# Patient Record
Sex: Female | Born: 1945 | Race: White | Hispanic: No | Marital: Married | Smoking: Never smoker
Health system: Southern US, Community
[De-identification: ages and names within clinical notes are randomized; demographics above are authoritative.]

## PROBLEM LIST (undated history)

## (undated) HISTORY — PX: TONSILECTOMY, ADENOIDECTOMY, BILATERAL MYRINGOTOMY AND TUBES: SHX2538

---

## 2016-10-29 ENCOUNTER — Encounter (HOSPITAL_COMMUNITY): Payer: Self-pay | Admitting: Emergency Medicine

## 2016-10-29 ENCOUNTER — Emergency Department (HOSPITAL_COMMUNITY): Payer: No Typology Code available for payment source

## 2016-10-29 ENCOUNTER — Inpatient Hospital Stay (HOSPITAL_COMMUNITY)
Admission: EM | Admit: 2016-10-29 | Discharge: 2016-11-08 | DRG: 481 | Disposition: A | Discharge status: Rehabilitation Facility | Payer: No Typology Code available for payment source | Attending: Orthopedic Surgery | Admitting: Orthopedic Surgery

## 2016-10-29 DIAGNOSIS — S0083XA Contusion of other part of head, initial encounter: Secondary | ICD-10-CM | POA: Diagnosis present

## 2016-10-29 DIAGNOSIS — E559 Vitamin D deficiency, unspecified: Secondary | ICD-10-CM | POA: Diagnosis present

## 2016-10-29 DIAGNOSIS — S72432A Displaced fracture of medial condyle of left femur, initial encounter for closed fracture: Principal | ICD-10-CM

## 2016-10-29 DIAGNOSIS — D62 Acute posthemorrhagic anemia: Secondary | ICD-10-CM

## 2016-10-29 DIAGNOSIS — Z419 Encounter for procedure for purposes other than remedying health state, unspecified: Secondary | ICD-10-CM

## 2016-10-29 DIAGNOSIS — R262 Difficulty in walking, not elsewhere classified: Secondary | ICD-10-CM

## 2016-10-29 DIAGNOSIS — T148XXA Other injury of unspecified body region, initial encounter: Secondary | ICD-10-CM | POA: Diagnosis not present

## 2016-10-29 DIAGNOSIS — R300 Dysuria: Secondary | ICD-10-CM | POA: Diagnosis not present

## 2016-10-29 DIAGNOSIS — Y92410 Unspecified street and highway as the place of occurrence of the external cause: Secondary | ICD-10-CM | POA: Diagnosis not present

## 2016-10-29 DIAGNOSIS — K5903 Drug induced constipation: Secondary | ICD-10-CM

## 2016-10-29 DIAGNOSIS — S72435S Nondisplaced fracture of medial condyle of left femur, sequela: Secondary | ICD-10-CM | POA: Diagnosis not present

## 2016-10-29 DIAGNOSIS — I10 Essential (primary) hypertension: Secondary | ICD-10-CM | POA: Diagnosis present

## 2016-10-29 DIAGNOSIS — R739 Hyperglycemia, unspecified: Secondary | ICD-10-CM | POA: Diagnosis present

## 2016-10-29 DIAGNOSIS — I158 Other secondary hypertension: Secondary | ICD-10-CM | POA: Diagnosis not present

## 2016-10-29 DIAGNOSIS — E8809 Other disorders of plasma-protein metabolism, not elsewhere classified: Secondary | ICD-10-CM | POA: Diagnosis not present

## 2016-10-29 DIAGNOSIS — S72432G Displaced fracture of medial condyle of left femur, subsequent encounter for closed fracture with delayed healing: Secondary | ICD-10-CM | POA: Diagnosis not present

## 2016-10-29 DIAGNOSIS — D72829 Elevated white blood cell count, unspecified: Secondary | ICD-10-CM | POA: Diagnosis present

## 2016-10-29 DIAGNOSIS — G8918 Other acute postprocedural pain: Secondary | ICD-10-CM | POA: Diagnosis not present

## 2016-10-29 DIAGNOSIS — I82432 Acute embolism and thrombosis of left popliteal vein: Secondary | ICD-10-CM | POA: Diagnosis not present

## 2016-10-29 DIAGNOSIS — M25562 Pain in left knee: Secondary | ICD-10-CM | POA: Diagnosis present

## 2016-10-29 DIAGNOSIS — S72415S Nondisplaced unspecified condyle fracture of lower end of left femur, sequela: Secondary | ICD-10-CM | POA: Diagnosis not present

## 2016-10-29 LAB — CBC WITH DIFFERENTIAL/PLATELET
Basophils Absolute: 0 10*3/uL (ref 0.0–0.1)
Basophils Relative: 0 %
Eosinophils Absolute: 0 10*3/uL (ref 0.0–0.7)
Eosinophils Relative: 0 %
HCT: 39.9 % (ref 36.0–46.0)
Hemoglobin: 14 g/dL (ref 12.0–15.0)
Lymphocytes Relative: 15 %
Lymphs Abs: 2.5 10*3/uL (ref 0.7–4.0)
MCH: 32.1 pg (ref 26.0–34.0)
MCHC: 35.1 g/dL (ref 30.0–36.0)
MCV: 91.5 fL (ref 78.0–100.0)
Monocytes Absolute: 1.3 10*3/uL — ABNORMAL HIGH (ref 0.1–1.0)
Monocytes Relative: 8 %
Neutro Abs: 12.7 10*3/uL — ABNORMAL HIGH (ref 1.7–7.7)
Neutrophils Relative %: 77 %
Platelets: 332 10*3/uL (ref 150–400)
RBC: 4.36 MIL/uL (ref 3.87–5.11)
RDW: 12.4 % (ref 11.5–15.5)
WBC: 16.6 10*3/uL — ABNORMAL HIGH (ref 4.0–10.5)

## 2016-10-29 MED ORDER — ONDANSETRON HCL 4 MG/2ML IJ SOLN
4.0000 mg | Freq: Once | INTRAMUSCULAR | Status: AC
  Administered 2016-10-29: 4 mg via INTRAVENOUS
  Filled 2016-10-29: qty 2

## 2016-10-29 MED ORDER — SODIUM CHLORIDE 0.9 % IV BOLUS (SEPSIS)
1000.0000 mL | Freq: Once | INTRAVENOUS | Status: AC
  Administered 2016-10-29: 1000 mL via INTRAVENOUS

## 2016-10-29 MED ORDER — HYDROMORPHONE HCL 2 MG/ML IJ SOLN: 1.0000 mg | Freq: Once | INTRAMUSCULAR | Status: DC

## 2016-10-29 MED ORDER — HYDROMORPHONE HCL 2 MG/ML IJ SOLN
0.5000 mg | Freq: Once | INTRAMUSCULAR | Status: AC
  Administered 2016-10-29: 0.5 mg via INTRAVENOUS
  Filled 2016-10-29: qty 1

## 2016-10-29 MED ORDER — IOPAMIDOL (ISOVUE-300) INJECTION 61%
INTRAVENOUS | Status: AC
  Administered 2016-10-29: 100 mL
  Filled 2016-10-29: qty 100
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
## 2016-10-30 ENCOUNTER — Inpatient Hospital Stay (HOSPITAL_COMMUNITY): Payer: No Typology Code available for payment source

## 2016-10-30 LAB — LACTIC ACID, PLASMA
LACTIC ACID, VENOUS: 1.4 mmol/L (ref 0.5–1.9)
LACTIC ACID, VENOUS: 3.2 mmol/L — AB (ref 0.5–1.9)
Lactic Acid, Venous: 3.3 mmol/L (ref 0.5–1.9)

## 2016-10-30 LAB — BASIC METABOLIC PANEL
Anion gap: 10 (ref 5–15)
Anion gap: 8 (ref 5–15)
BUN: 13 mg/dL (ref 6–20)
BUN: 9 mg/dL (ref 6–20)
CALCIUM: 8.6 mg/dL — AB (ref 8.9–10.3)
CO2: 24 mmol/L (ref 22–32)
CO2: 23 mmol/L (ref 22–32)
CREATININE: 0.81 mg/dL (ref 0.44–1.00)
Calcium: 8.6 mg/dL — ABNORMAL LOW (ref 8.9–10.3)
Chloride: 106 mmol/L (ref 101–111)
Chloride: 108 mmol/L (ref 101–111)
Creatinine, Ser: 0.88 mg/dL (ref 0.44–1.00)
GFR calc Af Amer: >60 mL/min (ref >60)
GFR calc non Af Amer: >60 mL/min (ref >60)
Glucose, Bld: 119 mg/dL — ABNORMAL HIGH (ref 65–99)
Glucose, Bld: 132 mg/dL — ABNORMAL HIGH (ref 65–99)
Potassium: 3.7 mmol/L (ref 3.5–5.1)
Potassium: 4 mmol/L (ref 3.5–5.1)
SODIUM: 139 mmol/L (ref 135–145)
Sodium: 140 mmol/L (ref 135–145)

## 2016-10-30 LAB — CBC
HEMATOCRIT: 37.7 % (ref 36.0–46.0)
Hemoglobin: 13.1 g/dL (ref 12.0–15.0)
MCH: 32 pg (ref 26.0–34.0)
MCHC: 34.7 g/dL (ref 30.0–36.0)
MCV: 92 fL (ref 78.0–100.0)
PLATELETS: 343 10*3/uL (ref 150–400)
RBC: 4.1 MIL/uL (ref 3.87–5.11)
RDW: 12.5 % (ref 11.5–15.5)
WBC: 11.2 10*3/uL — AB (ref 4.0–10.5)

## 2016-10-30 MED ORDER — DEXTROSE 5 % IV SOLN: 500.0000 mg | Freq: Four times a day (QID) | INTRAVENOUS | Status: DC | PRN

## 2016-10-30 MED ORDER — PROMETHAZINE HCL 25 MG/ML IJ SOLN
12.5000 mg | Freq: Four times a day (QID) | INTRAMUSCULAR | Status: DC | PRN
  Administered 2016-10-30 – 2016-11-04 (×4): 12.5 mg via INTRAVENOUS
  Filled 2016-10-30 (×4): qty 1

## 2016-10-30 MED ORDER — ONDANSETRON HCL 4 MG/2ML IJ SOLN
4.0000 mg | Freq: Four times a day (QID) | INTRAMUSCULAR | Status: DC | PRN
  Administered 2016-10-30: 4 mg via INTRAVENOUS
  Filled 2016-10-30: qty 2

## 2016-10-30 MED ORDER — DIPHENHYDRAMINE HCL 12.5 MG/5ML PO ELIX
12.5000 mg | ORAL_SOLUTION | ORAL | Status: DC | PRN
  Administered 2016-11-05: 12.5 mg via ORAL
  Filled 2016-10-30: qty 10

## 2016-10-30 MED ORDER — OXYCODONE HCL 5 MG PO TABS
5.0000 mg | ORAL_TABLET | ORAL | Status: DC | PRN
  Administered 2016-11-02 (×2): 5 mg via ORAL
  Administered 2016-11-02 – 2016-11-08 (×34): 10 mg via ORAL
  Filled 2016-10-30 (×25): qty 2
  Filled 2016-10-30: qty 1
  Filled 2016-10-30 (×10): qty 2

## 2016-10-30 MED ORDER — METHOCARBAMOL 500 MG PO TABS
500.0000 mg | ORAL_TABLET | Freq: Four times a day (QID) | ORAL | Status: DC | PRN
  Administered 2016-10-30 – 2016-11-01 (×5): 500 mg via ORAL
  Filled 2016-10-30 (×6): qty 1

## 2016-10-30 MED ORDER — TRAMADOL HCL 50 MG PO TABS
50.0000 mg | ORAL_TABLET | Freq: Four times a day (QID) | ORAL | Status: DC | PRN
  Administered 2016-10-31 – 2016-11-01 (×5): 50 mg via ORAL
  Filled 2016-10-30 (×5): qty 1

## 2016-10-30 MED ORDER — ACETAMINOPHEN 650 MG RE SUPP: 650.0000 mg | Freq: Four times a day (QID) | RECTAL | Status: DC | PRN

## 2016-10-30 MED ORDER — SODIUM CHLORIDE 0.9 % IV SOLN
INTRAVENOUS | Status: DC
  Administered 2016-10-30 – 2016-11-01 (×3): via INTRAVENOUS

## 2016-10-30 MED ORDER — HYDROMORPHONE HCL 2 MG/ML IJ SOLN
1.0000 mg | INTRAMUSCULAR | Status: DC | PRN
  Administered 2016-10-30 – 2016-11-04 (×4): 1 mg via INTRAVENOUS
  Filled 2016-10-30 (×5): qty 1

## 2016-10-30 MED ORDER — ACETAMINOPHEN 325 MG PO TABS
650.0000 mg | ORAL_TABLET | Freq: Four times a day (QID) | ORAL | Status: DC | PRN
  Administered 2016-10-30 – 2016-10-31 (×4): 650 mg via ORAL
  Filled 2016-10-30 (×4): qty 2

## 2016-10-30 NOTE — Progress Notes (Signed)
Patient complained of nausea and vomiting most of the day. Contacted Dr. August Saucerean and obtained an order for Zofran prn. Administered at 1520 via IV. Patient complained immediately of feeling flushed and had more nausea. Further complaints were headache, dizziness, feeling warm all over, and diaphoresis was observed. Patient requests no more Zofran be given. Gildardo CrankerAnita Priddy, RN
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
## 2016-10-30 NOTE — Progress Notes (Signed)
PT Cancellation Note  Patient Details Name: [name] FullingJanet White MRN: [id number] DOB: [birth year]   Cancelled Treatment:     Hold until 10/31/2016 per MD for CT of Left and x-ray of right knee.    Dessie ComaDavid J Jaquel Coomer 10/30/2016, 3:46 PM

## 2016-10-30 NOTE — ED Notes (Signed)
Attempted to call floor again, still no answer. This RN will bring Pt to floor and do bedside reporting.
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
## 2016-10-30 NOTE — ED Notes (Signed)
Attempted to call report, nobody answered.

## 2016-10-30 NOTE — Progress Notes (Signed)
OT Cancellation Note  Patient Details Name: [name] FullingJanet White MRN: [id number] DOB: 1946-08-23   Cancelled Treatment:    Reason Eval/Treat Not Completed: Medical issues which prohibited therapy. MD requesting hold therapy until after CT scan. OT will continue to follow.   Evern BioLaura J Shandy Checo 10/30/2016, 10:03 AM  Sherryl MangesLaura Arieonna Medine OTR/L [phone]

## 2016-10-30 NOTE — Progress Notes (Signed)
Patient is admitted in room 5N31, alert, oriented x4, abraisions noted to left side of nose, left cheeck left eye lid and left side of face, all are opened to air and not draining or bleeding, right knee and left elbow  With some swelling and bruises, left 2nd and 3rd fingers with bruises, left knee with immobilizer and unable to remove it due to pain, no bleeding noted, no other skin issues noted other than mentioned above

## 2016-10-30 NOTE — Progress Notes (Signed)
Call received from Lab for a critic Lactic Acid of 3.3 earlier tonight, MDs answering service was called and related message for on call physician, no call back received until now. Patient is resting in the bed with eyes closed at present moment, no S/S of acute distress noted, O2 on at 2liters via N/C , sats 96-98%, family at bedside. Will continue to monitor.

## 2016-10-30 NOTE — Plan of Care (Signed)
Problem: Safety: Goal: Ability to remain free from injury will improve Outcome: Progressing On bed rest tonight  Problem: Pain Managment: Goal: General experience of comfort will improve Outcome: Progressing Medicated once for pain, resting in the bed with eyes closed at present time  Problem: Skin Integrity: Goal: Risk for impaired skin integrity will decrease Outcome: Progressing Bruises and small hematoma noted to right knee and left elbow  Problem: Activity: Goal: Risk for activity intolerance will decrease Outcome: Progressing On bed rest, C/O severe pain in the left leg, does not tolerate turning and repositioning  Problem: Bowel/Gastric: Goal: Will not experience complications related to bowel motility Outcome: Progressing Denies gastric and bowel issues

## 2016-10-31 LAB — CBC
HEMATOCRIT: 36 % (ref 36.0–46.0)
HEMOGLOBIN: 12.1 g/dL (ref 12.0–15.0)
MCH: 31.3 pg (ref 26.0–34.0)
MCHC: 33.6 g/dL (ref 30.0–36.0)
MCV: 93 fL (ref 78.0–100.0)
Platelets: 304 10*3/uL (ref 150–400)
RBC: 3.87 MIL/uL (ref 3.87–5.11)
RDW: 12.6 % (ref 11.5–15.5)
WBC: 10.5 10*3/uL (ref 4.0–10.5)

## 2016-10-31 MED ORDER — ENOXAPARIN SODIUM 40 MG/0.4ML ~~LOC~~ SOLN
40.0000 mg | SUBCUTANEOUS | Status: DC
  Filled 2016-10-31: qty 0.4

## 2016-10-31 MED ORDER — CYCLOBENZAPRINE HCL 10 MG PO TABS
5.0000 mg | ORAL_TABLET | Freq: Three times a day (TID) | ORAL | Status: DC | PRN
  Administered 2016-10-31: 10 mg via ORAL
  Filled 2016-10-31: qty 1

## 2016-10-31 NOTE — Progress Notes (Signed)
Patient is alert, oriented x4, awake at present time, medicated for pain with Tylenol twice, Phenergan twice to prevent nausea/vomiting and once with Tramadol all  with moderate relief. Skin care completed, patient has been turned and repositioned. Knee immobilizer remains on, Foot pump on, VS WNL, no visual signs of acute distress noted, son and daughter at bedside at present time, will continue to monitor.

## 2016-10-31 NOTE — Progress Notes (Signed)
Rehab Admissions Coordinator Note:  Patient was screened by Trish MageLogue, Piero Mustard M for appropriateness for an Inpatient Acute Rehab Consult.  At this time, we are recommending Skilled Nursing Facility.  [facility] and it is unlikely that they would authorize an acute inpatient rehab admission given current diagnosis.  However, if MD chooses to pursue inpatient rehab, can order rehab consult.  Trish MageLogue, Delson Dulworth M 10/31/2016, 7:07 PM  I can be reached at [phone].
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
## 2016-10-31 NOTE — Progress Notes (Signed)
Subjective: No acute changes.  Feels better.  Vitals and H/H stable.  Objective: Vital signs in last 24 hours: Temp:  [97.9 F (36.6 C)-98.7 F (37.1 C)] 97.9 F (36.6 C) (01/14 0450) Pulse Rate:  [69-76] 71 (01/14 0450) Resp:  [16] 16 (01/14 0450) BP: (133-149)/(5-62) 137/5 (01/14 0450) SpO2:  [94 %-98 %] 98 % (01/14 0450)  Intake/Output from previous day: 01/13 0701 - 01/14 0700 In: 1185 [P.O.:360; I.V.:825] Out: -  Intake/Output this shift: Total I/O In: 120 [P.O.:120] Out: 300 [Urine:300]   Recent Labs  10/29/16 2120 10/30/16 1005 10/31/16 0440  HGB 14.0 13.1 12.1    Recent Labs  10/30/16 1005 10/31/16 0440  WBC 11.2* 10.5  RBC 4.10 3.87  HCT 37.7 36.0  PLT 343 304    Recent Labs  10/29/16 2120 10/30/16 1005  NA 140 139  K 3.7 4.0  CL 106 108  CO2 24 23  BUN 13 9  CREATININE 0.88 0.81  GLUCOSE 119* 132*  CALCIUM 8.6* 8.6*   No results for input(s): LABPT, INR in the last 72 hours.  Sensation intact distally Intact pulses distally Dorsiflexion/Plantar flexion intact Compartment soft Left knee swollen (to be expected). Abdomen soft  Assessment/Plan: Left distal femur fracture 1)  I reviewed the CT scan of her left knee at the bedside.  She will need surgery to stabilize the medial epicondyl fracture.  Will plan on surgery possibly Tuesday afternoon.  I have reached out to Dr. Carola FrostHandy, Ortho Trauma, for a consultation depending on his availability.  The family understands this as well.  Her right knee x-ray was normal and that knee only hurts over her bruise.  She can get up to a wheelchair today to go see her husband who is also hospitalized here.   Kathryne HitchChristopher Y Tarez Bowns 10/31/2016, 8:57 AM
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
## 2016-11-01 ENCOUNTER — Encounter (HOSPITAL_COMMUNITY): Payer: Self-pay | Admitting: *Deleted

## 2016-11-01 DIAGNOSIS — S72432G Displaced fracture of medial condyle of left femur, subsequent encounter for closed fracture with delayed healing: Secondary | ICD-10-CM

## 2016-11-01 DIAGNOSIS — T148XXA Other injury of unspecified body region, initial encounter: Secondary | ICD-10-CM

## 2016-11-01 DIAGNOSIS — R262 Difficulty in walking, not elsewhere classified: Secondary | ICD-10-CM

## 2016-11-01 DIAGNOSIS — R739 Hyperglycemia, unspecified: Secondary | ICD-10-CM

## 2016-11-01 DIAGNOSIS — I10 Essential (primary) hypertension: Secondary | ICD-10-CM

## 2016-11-01 DIAGNOSIS — D72829 Elevated white blood cell count, unspecified: Secondary | ICD-10-CM

## 2016-11-01 DIAGNOSIS — G8918 Other acute postprocedural pain: Secondary | ICD-10-CM

## 2016-11-01 LAB — CBC
HEMATOCRIT: 36.3 % (ref 36.0–46.0)
HEMOGLOBIN: 12.2 g/dL (ref 12.0–15.0)
MCH: 31.3 pg (ref 26.0–34.0)
MCHC: 33.6 g/dL (ref 30.0–36.0)
MCV: 93.1 fL (ref 78.0–100.0)
Platelets: 335 10*3/uL (ref 150–400)
RBC: 3.9 MIL/uL (ref 3.87–5.11)
RDW: 12.5 % (ref 11.5–15.5)
WBC: 11 10*3/uL — AB (ref 4.0–10.5)

## 2016-11-01 LAB — BASIC METABOLIC PANEL
Anion gap: 6 (ref 5–15)
BUN: 5 mg/dL — ABNORMAL LOW (ref 6–20)
CHLORIDE: 111 mmol/L (ref 101–111)
CO2: 26 mmol/L (ref 22–32)
Calcium: 8.6 mg/dL — ABNORMAL LOW (ref 8.9–10.3)
Creatinine, Ser: 0.75 mg/dL (ref 0.44–1.00)
GFR calc non Af Amer: >60 mL/min (ref >60)
Glucose, Bld: 107 mg/dL — ABNORMAL HIGH (ref 65–99)
POTASSIUM: 3.8 mmol/L (ref 3.5–5.1)
Sodium: 143 mmol/L (ref 135–145)

## 2016-11-01 MED ORDER — TRAMADOL HCL 50 MG PO TABS
100.0000 mg | ORAL_TABLET | Freq: Four times a day (QID) | ORAL | Status: DC | PRN
  Administered 2016-11-01 – 2016-11-08 (×6): 100 mg via ORAL
  Filled 2016-11-01 (×6): qty 2

## 2016-11-01 MED ORDER — ACETAMINOPHEN 500 MG PO TABS
1000.0000 mg | ORAL_TABLET | Freq: Four times a day (QID) | ORAL | Status: DC
  Administered 2016-11-01 – 2016-11-06 (×19): 1000 mg via ORAL
  Administered 2016-11-06: 500 mg via ORAL
  Administered 2016-11-06 – 2016-11-08 (×7): 1000 mg via ORAL
  Filled 2016-11-01 (×30): qty 2

## 2016-11-01 MED ORDER — ACETAMINOPHEN 650 MG RE SUPP
650.0000 mg | Freq: Four times a day (QID) | RECTAL | Status: DC
  Filled 2016-11-01: qty 1

## 2016-11-01 MED ORDER — METHOCARBAMOL 500 MG PO TABS
500.0000 mg | ORAL_TABLET | Freq: Four times a day (QID) | ORAL | Status: DC | PRN
  Administered 2016-11-01 – 2016-11-03 (×7): 1000 mg via ORAL
  Administered 2016-11-03: 500 mg via ORAL
  Administered 2016-11-03: 1000 mg via ORAL
  Administered 2016-11-04 (×2): 500 mg via ORAL
  Administered 2016-11-04: 1000 mg via ORAL
  Administered 2016-11-05: 500 mg via ORAL
  Administered 2016-11-05 – 2016-11-07 (×7): 1000 mg via ORAL
  Administered 2016-11-08: 500 mg via ORAL
  Administered 2016-11-08: 1000 mg via ORAL
  Filled 2016-11-01 (×4): qty 2
  Filled 2016-11-01 (×3): qty 1
  Filled 2016-11-01 (×2): qty 2
  Filled 2016-11-01: qty 1
  Filled 2016-11-01 (×14): qty 2

## 2016-11-01 MED ORDER — METHOCARBAMOL 1000 MG/10ML IJ SOLN: 500.0000 mg | Freq: Four times a day (QID) | INTRAMUSCULAR | Status: DC | PRN

## 2016-11-01 MED ORDER — CEFAZOLIN SODIUM-DEXTROSE 2-4 GM/100ML-% IV SOLN
2.0000 g | Freq: Once | INTRAVENOUS | Status: AC
  Administered 2016-11-02: 2 g via INTRAVENOUS
  Filled 2016-11-01: qty 100
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
## 2016-11-01 NOTE — Anesthesia Preprocedure Evaluation (Addendum)
Anesthesia Evaluation  Patient identified by MRN, date of birth, ID band Patient awake    Reviewed: Allergy & Precautions, NPO status , Patient's Chart, lab work & pertinent test results  Airway Mallampati: II  TM Distance: >3 FB Neck ROM: Full    Dental no notable dental hx.    Pulmonary neg pulmonary ROS,    Pulmonary exam normal breath sounds clear to auscultation       Cardiovascular hypertension, Normal cardiovascular exam Rhythm:Regular Rate:Normal     Neuro/Psych negative neurological ROS  negative psych ROS   GI/Hepatic negative GI ROS, Neg liver ROS,   Endo/Other  negative endocrine ROS  Renal/GU negative Renal ROS     Musculoskeletal negative musculoskeletal ROS (+)   Abdominal   Peds  Hematology negative hematology ROS (+)   Anesthesia Other Findings   Reproductive/Obstetrics negative OB ROS                             Anesthesia Physical Anesthesia Plan  ASA: II  Anesthesia Plan: Spinal   Post-op Pain Management:  Regional for Post-op pain   Induction: Intravenous  Airway Management Planned:   Additional Equipment:   Intra-op Plan:   Post-operative Plan:   Informed Consent: I have reviewed the patients History and Physical, chart, labs and discussed the procedure including the risks, benefits and alternatives for the proposed anesthesia with the patient or authorized representative who has indicated his/her understanding and acceptance.   Dental advisory given  Plan Discussed with: CRNA  Anesthesia Plan Comments:       Anesthesia Quick Evaluation

## 2016-11-01 NOTE — Progress Notes (Addendum)
I met with pt and a grand daughter at bedside. I discussed a possible inpt rehab admission if approved by her [employer] Medicare insurance. I await therapy progress postop and then I will pursue approval for a CIR admission. [phone] 
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
## 2016-11-01 NOTE — Progress Notes (Signed)
Subjective: No acute changes.  Tried to mobilize more yesterday.  Understands surgery will be tomorrow.  Objective: Vital signs in last 24 hours: Temp:  [97.3 F (36.3 C)-98.8 F (37.1 C)] 97.3 F (36.3 C) (01/15 0500) Pulse Rate:  [71-83] 71 (01/15 0500) Resp:  [16] 16 (01/15 0500) BP: (140-153)/(57-70) 149/70 (01/15 0500) SpO2:  [94 %-98 %] 94 % (01/15 0500)  Intake/Output from previous day: 01/14 0701 - 01/15 0700 In: 880 [P.O.:880] Out: 725 [Urine:725] Intake/Output this shift: No intake/output data recorded.   Recent Labs  10/29/16 2120 10/30/16 1005 10/31/16 0440 11/01/16 0427  HGB 14.0 13.1 12.1 12.2    Recent Labs  10/31/16 0440 11/01/16 0427  WBC 10.5 11.0*  RBC 3.87 3.90  HCT 36.0 36.3  PLT 304 335    Recent Labs  10/30/16 1005 11/01/16 0427  NA 139 143  K 4.0 3.8  CL 108 111  CO2 23 26  BUN 9 5*  CREATININE 0.81 0.75  GLUCOSE 132* 107*  CALCIUM 8.6* 8.6*   No results for input(s): LABPT, INR in the last 72 hours.  Sensation intact distally Intact pulses distally Dorsiflexion/Plantar flexion intact Compartment soft  Assessment/Plan: Left distal femur medial epicondyl fracture. 1)  Will proceed to the OR tomorrow.  Have consulted Dr. Carola FrostHandy, Ortho Trauma, for his expertise.   [name] HitchChristopher Y Miryah Ralls 11/01/2016, 7:06 AM
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
## 2016-11-02 ENCOUNTER — Inpatient Hospital Stay (HOSPITAL_COMMUNITY): Payer: No Typology Code available for payment source

## 2016-11-02 ENCOUNTER — Encounter (HOSPITAL_COMMUNITY)
Admission: EM | Disposition: A | Discharge status: Rehabilitation Facility | Payer: Self-pay | Source: Home / Self Care | Attending: Orthopaedic Surgery

## 2016-11-02 ENCOUNTER — Encounter (HOSPITAL_COMMUNITY): Payer: Self-pay | Admitting: Certified Registered"

## 2016-11-02 ENCOUNTER — Inpatient Hospital Stay (HOSPITAL_COMMUNITY): Payer: No Typology Code available for payment source | Admitting: Anesthesiology

## 2016-11-02 HISTORY — PX: ORIF FEMUR FRACTURE: SHX2119

## 2016-11-02 LAB — CBC
HCT: 37.4 % (ref 36.0–46.0)
HCT: 38.3 % (ref 36.0–46.0)
Hemoglobin: 12.8 g/dL (ref 12.0–15.0)
Hemoglobin: 13.1 g/dL (ref 12.0–15.0)
MCH: 31.4 pg (ref 26.0–34.0)
MCH: 31.3 pg (ref 26.0–34.0)
MCHC: 34.2 g/dL (ref 30.0–36.0)
MCHC: 34.2 g/dL (ref 30.0–36.0)
MCV: 91.7 fL (ref 78.0–100.0)
MCV: 91.4 fL (ref 78.0–100.0)
PLATELETS: 365 10*3/uL (ref 150–400)
Platelets: 375 10*3/uL (ref 150–400)
RBC: 4.08 MIL/uL (ref 3.87–5.11)
RBC: 4.19 MIL/uL (ref 3.87–5.11)
RDW: 12.6 % (ref 11.5–15.5)
RDW: 12.7 % (ref 11.5–15.5)
WBC: 11.3 10*3/uL — ABNORMAL HIGH (ref 4.0–10.5)
WBC: 10.9 10*3/uL — ABNORMAL HIGH (ref 4.0–10.5)

## 2016-11-02 LAB — TYPE AND SCREEN
ABO/RH(D): B POS
Antibody Screen: NEGATIVE

## 2016-11-02 LAB — COMPREHENSIVE METABOLIC PANEL
ALBUMIN: 3.2 g/dL — AB (ref 3.5–5.0)
ALK PHOS: 67 U/L (ref 38–126)
ALT: 43 U/L (ref 14–54)
ANION GAP: 9 (ref 5–15)
AST: 25 U/L (ref 15–41)
BUN: 6 mg/dL (ref 6–20)
CALCIUM: 8.9 mg/dL (ref 8.9–10.3)
CO2: 25 mmol/L (ref 22–32)
Chloride: 107 mmol/L (ref 101–111)
Creatinine, Ser: 0.7 mg/dL (ref 0.44–1.00)
GFR calc Af Amer: >60 mL/min (ref >60)
GFR calc non Af Amer: >60 mL/min (ref >60)
GLUCOSE: 107 mg/dL — AB (ref 65–99)
Potassium: 3.6 mmol/L (ref 3.5–5.1)
SODIUM: 141 mmol/L (ref 135–145)
Total Bilirubin: 0.8 mg/dL (ref 0.3–1.2)
Total Protein: 6.1 g/dL — ABNORMAL LOW (ref 6.5–8.1)

## 2016-11-02 LAB — SURGICAL PCR SCREEN
MRSA, PCR: NEGATIVE
STAPHYLOCOCCUS AUREUS: NEGATIVE

## 2016-11-02 LAB — ABO/RH: ABO/RH(D): B POS

## 2016-11-02 LAB — PROTIME-INR
INR: 1.06
PROTHROMBIN TIME: 13.8 s (ref 11.4–15.2)

## 2016-11-02 LAB — CREATININE, SERUM
Creatinine, Ser: 0.72 mg/dL (ref 0.44–1.00)
GFR calc Af Amer: >60 mL/min (ref >60)
GFR calc non Af Amer: >60 mL/min (ref >60)

## 2016-11-02 LAB — APTT: APTT: 29 s (ref 24–36)

## 2016-11-02 SURGERY — OPEN REDUCTION INTERNAL FIXATION (ORIF) DISTAL FEMUR FRACTURE
Anesthesia: Regional | Laterality: Left

## 2016-11-02 MED ORDER — PROPOFOL 10 MG/ML IV BOLUS
INTRAVENOUS | Status: DC | PRN
  Administered 2016-11-02: 30 mg via INTRAVENOUS

## 2016-11-02 MED ORDER — WHITE PETROLATUM GEL: 1.0000 "application " | Status: DC | PRN

## 2016-11-02 MED ORDER — FENTANYL CITRATE (PF) 100 MCG/2ML IJ SOLN
INTRAMUSCULAR | Status: AC
  Filled 2016-11-02: qty 4

## 2016-11-02 MED ORDER — ENOXAPARIN SODIUM 40 MG/0.4ML ~~LOC~~ SOLN
40.0000 mg | SUBCUTANEOUS | Status: DC
  Administered 2016-11-03 – 2016-11-08 (×6): 40 mg via SUBCUTANEOUS
  Filled 2016-11-02 (×6): qty 0.4

## 2016-11-02 MED ORDER — LACTATED RINGERS IV SOLN
INTRAVENOUS | Status: DC | PRN
Start: 2016-11-02 — End: 2016-11-02
  Administered 2016-11-02: 08:00:00 via INTRAVENOUS

## 2016-11-02 MED ORDER — DOCUSATE SODIUM 100 MG PO CAPS
100.0000 mg | ORAL_CAPSULE | Freq: Two times a day (BID) | ORAL | Status: DC
  Administered 2016-11-02 – 2016-11-08 (×14): 100 mg via ORAL
  Filled 2016-11-02 (×14): qty 1

## 2016-11-02 MED ORDER — DEXTROSE 5 % IV SOLN
INTRAVENOUS | Status: DC | PRN
  Administered 2016-11-02: 50 ug/min via INTRAVENOUS

## 2016-11-02 MED ORDER — FENTANYL CITRATE (PF) 100 MCG/2ML IJ SOLN
INTRAMUSCULAR | Status: DC | PRN
  Administered 2016-11-02: 50 ug via INTRAVENOUS
  Administered 2016-11-02: 25 ug via INTRAVENOUS

## 2016-11-02 MED ORDER — VITAMIN C 500 MG PO TABS
500.0000 mg | ORAL_TABLET | Freq: Every day | ORAL | Status: DC
  Administered 2016-11-02 – 2016-11-08 (×7): 500 mg via ORAL
  Filled 2016-11-02 (×7): qty 1

## 2016-11-02 MED ORDER — BISACODYL 5 MG PO TBEC
5.0000 mg | DELAYED_RELEASE_TABLET | Freq: Every day | ORAL | Status: DC | PRN
  Administered 2016-11-03: 5 mg via ORAL
  Filled 2016-11-02: qty 1

## 2016-11-02 MED ORDER — BUPIVACAINE-EPINEPHRINE (PF) 0.5% -1:200000 IJ SOLN
INTRAMUSCULAR | Status: DC | PRN
  Administered 2016-11-02: 30 mL via PERINEURAL

## 2016-11-02 MED ORDER — POLYETHYLENE GLYCOL 3350 17 G PO PACK
17.0000 g | PACK | Freq: Every day | ORAL | Status: DC
  Administered 2016-11-02 – 2016-11-08 (×7): 17 g via ORAL
  Filled 2016-11-02 (×7): qty 1

## 2016-11-02 MED ORDER — VITAMIN D3 25 MCG (1000 UNIT) PO TABS
1000.0000 [IU] | ORAL_TABLET | Freq: Every day | ORAL | Status: DC
  Administered 2016-11-02 – 2016-11-03 (×2): 1000 [IU] via ORAL
  Filled 2016-11-02 (×4): qty 1

## 2016-11-02 MED ORDER — PROPOFOL 10 MG/ML IV BOLUS
INTRAVENOUS | Status: AC
  Filled 2016-11-02: qty 20

## 2016-11-02 MED ORDER — MAGNESIUM CITRATE PO SOLN
1.0000 | Freq: Once | ORAL | Status: DC | PRN
  Filled 2016-11-02: qty 296

## 2016-11-02 MED ORDER — WHITE PETROLATUM GEL
Status: AC
Start: 2016-11-02 — End: 2016-11-03
  Filled 2016-11-02: qty 1

## 2016-11-02 MED ORDER — POTASSIUM CHLORIDE IN NACL 20-0.9 MEQ/L-% IV SOLN
INTRAVENOUS | Status: DC
  Administered 2016-11-02: 15:00:00 via INTRAVENOUS
  Filled 2016-11-02 (×2): qty 1000

## 2016-11-02 MED ORDER — PROPOFOL 500 MG/50ML IV EMUL
INTRAVENOUS | Status: DC | PRN
  Administered 2016-11-02: 75 ug/kg/min via INTRAVENOUS

## 2016-11-02 MED ORDER — CEFAZOLIN IN D5W 1 GM/50ML IV SOLN
1.0000 g | Freq: Four times a day (QID) | INTRAVENOUS | Status: AC
  Administered 2016-11-02 (×2): 1 g via INTRAVENOUS
  Filled 2016-11-02 (×3): qty 50

## 2016-11-02 MED ORDER — METOCLOPRAMIDE HCL 5 MG PO TABS: 5.0000 mg | ORAL_TABLET | Freq: Three times a day (TID) | ORAL | Status: DC | PRN

## 2016-11-02 MED ORDER — 0.9 % SODIUM CHLORIDE (POUR BTL) OPTIME
TOPICAL | Status: DC | PRN
  Administered 2016-11-02: 1000 mL

## 2016-11-02 MED ORDER — METOCLOPRAMIDE HCL 5 MG/ML IJ SOLN: 5.0000 mg | Freq: Three times a day (TID) | INTRAMUSCULAR | Status: DC | PRN

## 2016-11-02 SURGICAL SUPPLY — 69 items
BANDAGE ACE 4X5 VEL STRL LF (GAUZE/BANDAGES/DRESSINGS) ×3 IMPLANT
BANDAGE ACE 6X5 VEL STRL LF (GAUZE/BANDAGES/DRESSINGS) ×3 IMPLANT
BLADE SURG ROTATE 9660 (MISCELLANEOUS) IMPLANT
BNDG GAUZE ELAST 4 BULKY (GAUZE/BANDAGES/DRESSINGS) ×6 IMPLANT
BRUSH SCRUB DISP (MISCELLANEOUS) ×6 IMPLANT
CANISTER SUCT 3000ML PPV (MISCELLANEOUS) ×3 IMPLANT
COVER SURGICAL LIGHT HANDLE (MISCELLANEOUS) ×3 IMPLANT
DRAPE C-ARM 42X72 X-RAY (DRAPES) ×3 IMPLANT
DRAPE C-ARMOR (DRAPES) ×3 IMPLANT
DRAPE IMP U-DRAPE 54X76 (DRAPES) ×3 IMPLANT
DRAPE ORTHO SPLIT 77X108 STRL (DRAPES) ×4
DRAPE SURG ORHT 6 SPLT 77X108 (DRAPES) ×2 IMPLANT
DRAPE U-SHAPE 47X51 STRL (DRAPES) ×3 IMPLANT
DRSG ADAPTIC 3X8 NADH LF (GAUZE/BANDAGES/DRESSINGS) IMPLANT
DRSG MEPILEX BORDER 4X4 (GAUZE/BANDAGES/DRESSINGS) ×3 IMPLANT
DRSG PAD ABDOMINAL 8X10 ST (GAUZE/BANDAGES/DRESSINGS) ×12 IMPLANT
ELECT REM PT RETURN 9FT ADLT (ELECTROSURGICAL) ×3
ELECTRODE REM PT RTRN 9FT ADLT (ELECTROSURGICAL) ×1 IMPLANT
EVACUATOR 1/8 PVC DRAIN (DRAIN) IMPLANT
EVACUATOR 3/16  PVC DRAIN (DRAIN)
EVACUATOR 3/16 PVC DRAIN (DRAIN) IMPLANT
GAUZE SPONGE 4X4 12PLY STRL (GAUZE/BANDAGES/DRESSINGS) IMPLANT
GLOVE BIO SURGEON STRL SZ7.5 (GLOVE) ×3 IMPLANT
GLOVE BIO SURGEON STRL SZ8 (GLOVE) ×3 IMPLANT
GLOVE BIOGEL PI IND STRL 7.5 (GLOVE) ×1 IMPLANT
GLOVE BIOGEL PI IND STRL 8 (GLOVE) ×1 IMPLANT
GLOVE BIOGEL PI INDICATOR 7.5 (GLOVE) ×2
GLOVE BIOGEL PI INDICATOR 8 (GLOVE) ×2
GOWN STRL REUS W/ TWL LRG LVL3 (GOWN DISPOSABLE) ×2 IMPLANT
GOWN STRL REUS W/ TWL XL LVL3 (GOWN DISPOSABLE) ×1 IMPLANT
GOWN STRL REUS W/TWL LRG LVL3 (GOWN DISPOSABLE) ×4
GOWN STRL REUS W/TWL XL LVL3 (GOWN DISPOSABLE) ×2
KIT BASIN OR (CUSTOM PROCEDURE TRAY) ×3 IMPLANT
KIT ROOM TURNOVER OR (KITS) ×3 IMPLANT
NEEDLE 22X1 1/2 (OR ONLY) (NEEDLE) IMPLANT
NS IRRIG 1000ML POUR BTL (IV SOLUTION) ×3 IMPLANT
PACK TOTAL JOINT (CUSTOM PROCEDURE TRAY) ×3 IMPLANT
PACK UNIVERSAL I (CUSTOM PROCEDURE TRAY) IMPLANT
PAD ARMBOARD 7.5X6 YLW CONV (MISCELLANEOUS) ×6 IMPLANT
PAD CAST 4YDX4 CTTN HI CHSV (CAST SUPPLIES) ×1 IMPLANT
PADDING CAST COTTON 4X4 STRL (CAST SUPPLIES) ×2
PADDING CAST COTTON 6X4 STRL (CAST SUPPLIES) ×3 IMPLANT
PROS LCP PLATE 8H 111M (Plate) ×3 IMPLANT
PROSTHESIS LCP PLATE 8H 111M (Plate) ×1 IMPLANT
SCREW CANN 16 THRD/65 7.3 (Screw) ×3 IMPLANT
SCREW CANN 16 THRD/75 7.3 (Screw) ×3 IMPLANT
SCREW CORTEX 3.5 34MM (Screw) ×2 IMPLANT
SCREW CORTEX 3.5 38MM (Screw) ×2 IMPLANT
SCREW CORTEX 3.5 40MM (Screw) ×2 IMPLANT
SCREW CORTEX 3.5 45MM (Screw) ×3 IMPLANT
SCREW LOCK CORT ST 3.5X34 (Screw) ×1 IMPLANT
SCREW LOCK CORT ST 3.5X38 (Screw) ×1 IMPLANT
SCREW LOCK CORT ST 3.5X40 (Screw) ×1 IMPLANT
SPONGE LAP 18X18 X RAY DECT (DISPOSABLE) ×3 IMPLANT
STAPLER VISISTAT 35W (STAPLE) ×3 IMPLANT
SUCTION FRAZIER HANDLE 10FR (MISCELLANEOUS)
SUCTION TUBE FRAZIER 10FR DISP (MISCELLANEOUS) IMPLANT
SUT PROLENE 0 CT 2 (SUTURE) IMPLANT
SUT VIC AB 0 CT1 27 (SUTURE) ×2
SUT VIC AB 0 CT1 27XBRD ANBCTR (SUTURE) ×1 IMPLANT
SUT VIC AB 1 CT1 27 (SUTURE) ×2
SUT VIC AB 1 CT1 27XBRD ANBCTR (SUTURE) ×1 IMPLANT
SUT VIC AB 2-0 CT1 27 (SUTURE) ×4
SUT VIC AB 2-0 CT1 TAPERPNT 27 (SUTURE) ×2 IMPLANT
SYR 20ML ECCENTRIC (SYRINGE) IMPLANT
TOWEL OR 17X24 6PK STRL BLUE (TOWEL DISPOSABLE) ×3 IMPLANT
TOWEL OR 17X26 10 PK STRL BLUE (TOWEL DISPOSABLE) ×6 IMPLANT
TRAY FOLEY CATH 16FRSI W/METER (SET/KITS/TRAYS/PACK) IMPLANT
WATER STERILE IRR 1000ML POUR (IV SOLUTION) IMPLANT

## 2016-11-02 NOTE — Anesthesia Postprocedure Evaluation (Signed)
Anesthesia Post Note  Patient: [name] FullingJanet White  Procedure(s) Performed: Procedure(s) (LRB): OPEN REDUCTION INTERNAL FIXATION (ORIF) DISTAL FEMUR FRACTURE (Left)  Patient location during evaluation: PACU Anesthesia Type: Regional and Spinal Level of consciousness: awake and alert Pain management: pain level controlled Vital Signs Assessment: post-procedure vital signs reviewed and stable Respiratory status: spontaneous breathing and respiratory function stable Cardiovascular status: blood pressure returned to baseline and stable Postop Assessment: spinal receding Anesthetic complications: no       Last Vitals:  Vitals:   11/02/16 1201 11/02/16 1206  BP:    Pulse: 70 70  Resp: 16 16  Temp:  36.7 C    Last Pain:  Vitals:   11/02/16 1130  TempSrc:   PainSc: 0-No pain                 Lewie LoronJohn Brevin Mcfadden

## 2016-11-02 NOTE — Progress Notes (Signed)
Orthopedic Tech Progress Note Patient Details:  [name] FullingJanet White 13-Jul-1946 [id number]  Ortho Devices Type of Ortho Device:  (footsie roll) Ortho Device/Splint Location: trapeze bar patient helper Ortho Device/Splint Interventions: Application   [name] DomCrawford, [name] White 11/02/2016, 4:39 PM

## 2016-11-02 NOTE — Anesthesia Procedure Notes (Signed)
Procedure Name: MAC Date/Time: 11/02/2016 8:27 AM Performed by: Babs Bertin Pre-anesthesia Checklist: Patient identified, Emergency Drugs available, Suction available, Patient being monitored and Timeout performed Patient Re-evaluated:Patient Re-evaluated prior to inductionOxygen Delivery Method: Simple face mask

## 2016-11-02 NOTE — Progress Notes (Signed)
Orthopedic Tech Progress Note Patient Details:  [name] FullingJanet White 10/07/1946 [id number]  Patient ID: [name] FullingJanet White, female   DOB: 10/07/1946, 71 y.o.   MRN: [id number]   [name] DomCrawford, [name] White 11/02/2016, 3:03 PM Called in advanced brace order; [facility]

## 2016-11-02 NOTE — Anesthesia Procedure Notes (Addendum)
Anesthesia Regional Block:  Femoral nerve block  Pre-Anesthetic Checklist: ,, timeout performed, Correct Patient, Correct Site, Correct Laterality, Correct Procedure, Correct Position, site marked, Risks and benefits discussed,  Surgical consent,  Pre-op evaluation,  At surgeon's request and post-op pain management  Laterality: Left  Prep: chloraprep       Needles:  Injection technique: Single-shot  Needle Type: Stimulator Needle - 80     Needle Length: 9cm 9 cm Needle Gauge: 22 and 22 G  Needle insertion depth: 6 cm   Additional Needles:  Procedures: ultrasound guided (picture in chart) Femoral nerve block Narrative:  Start time: 11/02/2016 8:00 AM End time: 11/02/2016 8:10 AM Injection made incrementally with aspirations every 5 mL.  Performed by: Personally  Anesthesiologist: Lewie LoronGERMEROTH, Kashayla Ungerer  Additional Notes: BP cuff, EKG monitors applied. Sedation begun. Femoral artery palpated for location of nerve. After nerve location verified with U/S, anesthetic injected incrementally, slowly, and after negative aspirations under direct u/s guidance. Good perineural spread. Patient tolerated well.     Dictation #1 [id number]  [id number]

## 2016-11-02 NOTE — Brief Op Note (Signed)
10/29/2016 - 11/02/2016  10:31 AM  PATIENT:  [name] FullingJanet White  71 y.o. female  PRE-OPERATIVE DIAGNOSIS:  LEFT DISTAL FEMUR FRACTURE WITH INTRAARTICULAR EXTENSION  POST-OPERATIVE DIAGNOSIS:  LEFT DISTAL FEMUR FRACTURE WITH INTRAARTICULAR EXTENSION   PROCEDURE:  Procedure(s): OPEN REDUCTION INTERNAL FIXATION (ORIF) DISTAL FEMUR FRACTURE (Left) WITH INTRAARTICULAR EXTENSION  SURGEON:  Surgeon(s) and Role:    * Myrene GalasMichael Penny Frisbie, MD - Primary  PHYSICIAN ASSISTANT: Montez MoritaKeith Paul, [facility]  ANESTHESIA:   regional  EBL:  No intake/output data recorded.  BLOOD ADMINISTERED:none  DRAINS: none   LOCAL MEDICATIONS USED:  NONE  SPECIMEN:  No Specimen  DISPOSITION OF SPECIMEN:  N/A  COUNTS:  YES  TOURNIQUET:  * No tourniquets in log *  DICTATION: .Other Dictation: Dictation Number [id number]  PLAN OF CARE: Admit to inpatient   PATIENT DISPOSITION:  PACU - hemodynamically stable.   Delay start of Pharmacological VTE agent (>24hrs) due to surgical blood loss or risk of bleeding: yes

## 2016-11-02 NOTE — Progress Notes (Signed)
Orthopedic Tech Progress Note Patient Details:  [name] FullingJanet White 12/28/1945 [id number]  Ortho Devices Type of Ortho Device:  (footsie roll) Ortho Device/Splint Location: rle Ortho Device/Splint Interventions: Application   [name] White 11/02/2016, 4:07 PM

## 2016-11-02 NOTE — Progress Notes (Signed)
Pt complaining of pain upon urination, urine sent for culture

## 2016-11-02 NOTE — Transfer of Care (Signed)
Immediate Anesthesia Transfer of Care Note  Patient: [name] FullingJanet White  Procedure(s) Performed: Procedure(s): OPEN REDUCTION INTERNAL FIXATION (ORIF) DISTAL FEMUR FRACTURE (Left)  Patient Location: PACU  Anesthesia Type:Spinal  Level of Consciousness: awake, alert  and oriented  Airway & Oxygen Therapy: Patient Spontanous Breathing and Patient connected to nasal cannula oxygen  Post-op Assessment: Report given to RN and Post -op Vital signs reviewed and stable  Post vital signs: Reviewed and stable  Last Vitals:  Vitals:   11/02/16 0625 11/02/16 1048  BP: (!) 157/82   Pulse: 80   Resp: 16   Temp: 36.8 C (P) 36.1 C    Last Pain:  Vitals:   11/02/16 0630  TempSrc:   PainSc: 3       Patients Stated Pain Goal: 0 (10/31/16 0700)  Complications: No apparent anesthesia complications

## 2016-11-03 LAB — CALCITRIOL (1,25 DI-OH VIT D): VIT D 1 25 DIHYDROXY: 122 pg/mL — AB (ref 19.9–79.3)

## 2016-11-03 LAB — URINALYSIS, ROUTINE W REFLEX MICROSCOPIC
BILIRUBIN URINE: NEGATIVE
Glucose, UA: NEGATIVE mg/dL
Hgb urine dipstick: NEGATIVE
Ketones, ur: NEGATIVE mg/dL
Nitrite: NEGATIVE
Protein, ur: NEGATIVE mg/dL
SPECIFIC GRAVITY, URINE: 1.016 (ref 1.005–1.030)
pH: 5 (ref 5.0–8.0)

## 2016-11-03 LAB — BASIC METABOLIC PANEL
Anion gap: 7 (ref 5–15)
BUN: 7 mg/dL (ref 6–20)
CHLORIDE: 106 mmol/L (ref 101–111)
CO2: 27 mmol/L (ref 22–32)
CREATININE: 0.75 mg/dL (ref 0.44–1.00)
Calcium: 8.7 mg/dL — ABNORMAL LOW (ref 8.9–10.3)
GFR calc Af Amer: >60 mL/min (ref >60)
GFR calc non Af Amer: >60 mL/min (ref >60)
GLUCOSE: 89 mg/dL (ref 65–99)
POTASSIUM: 4 mmol/L (ref 3.5–5.1)
SODIUM: 140 mmol/L (ref 135–145)

## 2016-11-03 LAB — CBC
HEMATOCRIT: 35.8 % — AB (ref 36.0–46.0)
Hemoglobin: 12.1 g/dL (ref 12.0–15.0)
MCH: 30.9 pg (ref 26.0–34.0)
MCHC: 33.8 g/dL (ref 30.0–36.0)
MCV: 91.6 fL (ref 78.0–100.0)
PLATELETS: 369 10*3/uL (ref 150–400)
RBC: 3.91 MIL/uL (ref 3.87–5.11)
RDW: 12.6 % (ref 11.5–15.5)
WBC: 9.7 10*3/uL (ref 4.0–10.5)

## 2016-11-03 LAB — VITAMIN D 25 HYDROXY (VIT D DEFICIENCY, FRACTURES): VIT D 25 HYDROXY: 26.8 ng/mL — AB (ref 30.0–100.0)

## 2016-11-03 MED ORDER — VITAMIN D 1000 UNITS PO TABS
4000.0000 [IU] | ORAL_TABLET | Freq: Every day | ORAL | Status: DC
  Administered 2016-11-04 – 2016-11-08 (×5): 4000 [IU] via ORAL
  Filled 2016-11-03 (×6): qty 4
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
## 2016-11-03 NOTE — Progress Notes (Signed)
I await therapy updates postop to then begin insurance authorization for a possible inpt rehab admission. [phone]

## 2016-11-03 NOTE — Care Management (Signed)
Case manager following for disposition. Vance PeperSusan Jawanza Zambito, RN BSN Case Manager
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
## 2016-11-03 NOTE — Progress Notes (Signed)
I await updated OT postop to pursue insurance authorization. I will follow up tomorrow. [phone]

## 2016-11-04 ENCOUNTER — Encounter (HOSPITAL_COMMUNITY): Payer: Self-pay | Admitting: Orthopedic Surgery

## 2016-11-04 LAB — URINE CULTURE: CULTURE: NO GROWTH

## 2016-11-04 LAB — CBC
HCT: 34.9 % — ABNORMAL LOW (ref 36.0–46.0)
Hemoglobin: 11.9 g/dL — ABNORMAL LOW (ref 12.0–15.0)
MCH: 31.2 pg (ref 26.0–34.0)
MCHC: 34.1 g/dL (ref 30.0–36.0)
MCV: 91.6 fL (ref 78.0–100.0)
PLATELETS: 401 10*3/uL — AB (ref 150–400)
RBC: 3.81 MIL/uL — ABNORMAL LOW (ref 3.87–5.11)
RDW: 12.7 % (ref 11.5–15.5)
WBC: 11 10*3/uL — ABNORMAL HIGH (ref 4.0–10.5)

## 2016-11-04 LAB — BASIC METABOLIC PANEL
Anion gap: 8 (ref 5–15)
BUN: 9 mg/dL (ref 6–20)
CALCIUM: 8.8 mg/dL — AB (ref 8.9–10.3)
CO2: 25 mmol/L (ref 22–32)
CREATININE: 0.7 mg/dL (ref 0.44–1.00)
Chloride: 108 mmol/L (ref 101–111)
GFR calc non Af Amer: >60 mL/min (ref >60)
Glucose, Bld: 95 mg/dL (ref 65–99)
Potassium: 3.8 mmol/L (ref 3.5–5.1)
SODIUM: 141 mmol/L (ref 135–145)
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
## 2016-11-04 NOTE — Progress Notes (Addendum)
I have begun insurance authorization for a possible admission tomorrow if approved.I spoke with RN, Tobi BastosAnna, and requested Mg citrate as ordered prn for pt without a BM since admission. [phone]

## 2016-11-04 NOTE — Progress Notes (Signed)
I have contacted Acute Therapy to request further PT treatment this morning to assist in insurance authorization for a possible inpt rehab admission. [phone]

## 2016-11-05 LAB — BASIC METABOLIC PANEL
Anion gap: 10 (ref 5–15)
BUN: 9 mg/dL (ref 6–20)
CALCIUM: 9 mg/dL (ref 8.9–10.3)
CO2: 27 mmol/L (ref 22–32)
CREATININE: 0.75 mg/dL (ref 0.44–1.00)
Chloride: 105 mmol/L (ref 101–111)
Glucose, Bld: 106 mg/dL — ABNORMAL HIGH (ref 65–99)
Potassium: 3.7 mmol/L (ref 3.5–5.1)
SODIUM: 142 mmol/L (ref 135–145)

## 2016-11-05 NOTE — Progress Notes (Signed)
Discussed with Montez MoritaKeith Paul, PA after peer to peer. [facility]. [phone]

## 2016-11-05 NOTE — Care Management Important Message (Signed)
Important Message  Patient Details  Name: [name] FullingJanet Lawrence MRN: [id number] Date of Birth: 11-Nov-1945   Medicare Important Message Given:  Yes    Leola Fiore 11/05/2016, 1:25 PM
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
## 2016-11-05 NOTE — Progress Notes (Signed)
Discussed with Montez MoritaKeith Paul, PA. We are pursuing a peer to peer appeal of [employer] Medicare denial for an inpt rehab admission today. I will update team when decision is made by Payor. [phone]
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
## 2016-11-06 NOTE — Progress Notes (Signed)
Orthopedic Tech Progress Note Patient Details:  [name] FullingJanet White 02/17/1946 [id number] Brace completed by [employer]. Patient ID: [name] White, female   DOB: 02/17/1946, 71 y.o.   MRN: [id number]   [name] White, [name] White 11/06/2016, 1:13 PM

## 2016-11-06 NOTE — Progress Notes (Signed)
Patient c/o some left flank/back soreness.  The area is swollen to the touch.  Provided ice pack, repositioned, and gave pain medication for left leg pain.
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
## 2016-11-07 LAB — URINALYSIS, ROUTINE W REFLEX MICROSCOPIC
BACTERIA UA: NONE SEEN
Bilirubin Urine: NEGATIVE
Glucose, UA: NEGATIVE mg/dL
Hgb urine dipstick: NEGATIVE
KETONES UR: NEGATIVE mg/dL
NITRITE: NEGATIVE
PROTEIN: NEGATIVE mg/dL
SPECIFIC GRAVITY, URINE: 1.015 (ref 1.005–1.030)
Squamous Epithelial / LPF: NONE SEEN
pH: 5 (ref 5.0–8.0)

## 2016-11-07 MED ORDER — CEFUROXIME AXETIL 500 MG PO TABS
500.0000 mg | ORAL_TABLET | Freq: Two times a day (BID) | ORAL | Status: DC
  Administered 2016-11-07: 500 mg via ORAL
  Filled 2016-11-07 (×2): qty 1

## 2016-11-07 NOTE — Progress Notes (Signed)
Patient ID: [name] FullingJanet White, female   DOB: 06/10/1946, 71 y.o.   MRN: [id number]  Patient was off the floor visiting her husband when I came by to see her.  I ordered a cath UA with culture and sensitivity for her urinary frequency.  I have started her on Ceftin 500mg  BID for her symptoms.    Ryler Laskowski A. Gwinda PasseShepperson, PA-C Physician Assistant [facility] [phone]  11/07/2016, 4:58 PM

## 2016-11-07 NOTE — Progress Notes (Signed)
Patient informed urine specimen will be needed the next time she voids.  Shared with oncoming shift.  Clean bedpan and collection container provided.
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
## 2016-11-07 NOTE — Care Management Note (Addendum)
Case Management Note  Patient Details  Name: [name] White MRN: [id number] Date of Birth: [birth year]  Subjective/Objective:                  OPEN REDUCTION INTERNAL FIXATION (ORIF) DISTAL FEMUR FRACTURE (Left) Action/Plan: Discharge planning Expected Discharge Date:                  Expected Discharge Plan:  Home w Home Health Services  In-House Referral:  NA  Discharge planning Services  CM Consult  Post Acute Care Choice:  Durable Medical Equipment, Home Health Choice offered to:  Patient, Adult Children  DME Arranged:  3-N-1, Walker rolling, Wheelchair manual DME Agency:  [facility] (PAtient has preference to get DME through outside company)  HH Arranged:  RN, PT, OT, Nurse's Aide HH Agency:  [facility]  Status of Service:  In process, will continue to follow  If discussed at Long Length of Stay Meetings, dates discussed:    Additional Comments: [facility] concerning probability of Approval of CIR (2nd Appeal) would not occur and to meet with pt and family for discharge plan.  Pt gives CM permission to discuss all medical matters with EXTENSIVE family members at bedside.  Though pt feels CIR would be her best opportunity for recovery states she will NOT accept a SNF placement bc she would not be able to visit her husband who is still in ICU and chooses Home Health instead.  CM offered choice of home health agency.  Family chooses [facility] to render all HH services and to provide DME.  Pt requires a hospital bed, wheelchair with Left Leg extension, rolling walker  to be delivered to home.  Family states there is always a family member to receive the DME.  CM has notified the RN to secure face to face and HHPT/OT/RN/Aide and DME orders.  Referral has  [facility], who is waiting for face to face and orders.  [facility] for delivery of DME and is waiting for orders.  Family contacts for delivery  of DME is Lanell MatarDenver Kolodny [phone] or [place]ittany [phone].  CM will continue to monitor for orders. [name], [name], RN 11/07/2016, 3:41 PM

## 2016-11-08 ENCOUNTER — Inpatient Hospital Stay (HOSPITAL_COMMUNITY)
Admission: RE | Admit: 2016-11-08 | Discharge: 2016-11-15 | DRG: 560 | Disposition: A | Discharge status: Home Health | Payer: Medicare Other | Source: Intra-hospital | Attending: Physical Medicine & Rehabilitation | Admitting: Physical Medicine & Rehabilitation

## 2016-11-08 DIAGNOSIS — K5903 Drug induced constipation: Secondary | ICD-10-CM

## 2016-11-08 DIAGNOSIS — K5900 Constipation, unspecified: Secondary | ICD-10-CM | POA: Diagnosis present

## 2016-11-08 DIAGNOSIS — R739 Hyperglycemia, unspecified: Secondary | ICD-10-CM

## 2016-11-08 DIAGNOSIS — S72433D Displaced fracture of medial condyle of unspecified femur, subsequent encounter for closed fracture with routine healing: Secondary | ICD-10-CM | POA: Diagnosis present

## 2016-11-08 DIAGNOSIS — I824Z2 Acute embolism and thrombosis of unspecified deep veins of left distal lower extremity: Secondary | ICD-10-CM | POA: Diagnosis present

## 2016-11-08 DIAGNOSIS — I158 Other secondary hypertension: Secondary | ICD-10-CM | POA: Diagnosis not present

## 2016-11-08 DIAGNOSIS — D62 Acute posthemorrhagic anemia: Secondary | ICD-10-CM | POA: Diagnosis present

## 2016-11-08 DIAGNOSIS — I82432 Acute embolism and thrombosis of left popliteal vein: Secondary | ICD-10-CM | POA: Diagnosis not present

## 2016-11-08 DIAGNOSIS — D72829 Elevated white blood cell count, unspecified: Secondary | ICD-10-CM | POA: Diagnosis present

## 2016-11-08 DIAGNOSIS — S72452D Displaced supracondylar fracture without intracondylar extension of lower end of left femur, subsequent encounter for closed fracture with routine healing: Secondary | ICD-10-CM | POA: Diagnosis not present

## 2016-11-08 DIAGNOSIS — I10 Essential (primary) hypertension: Secondary | ICD-10-CM | POA: Diagnosis present

## 2016-11-08 DIAGNOSIS — R197 Diarrhea, unspecified: Secondary | ICD-10-CM

## 2016-11-08 DIAGNOSIS — S0083XD Contusion of other part of head, subsequent encounter: Secondary | ICD-10-CM | POA: Diagnosis not present

## 2016-11-08 DIAGNOSIS — S72415S Nondisplaced unspecified condyle fracture of lower end of left femur, sequela: Secondary | ICD-10-CM

## 2016-11-08 DIAGNOSIS — S72435S Nondisplaced fracture of medial condyle of left femur, sequela: Secondary | ICD-10-CM | POA: Diagnosis not present

## 2016-11-08 DIAGNOSIS — M25562 Pain in left knee: Secondary | ICD-10-CM | POA: Diagnosis not present

## 2016-11-08 DIAGNOSIS — S72432A Displaced fracture of medial condyle of left femur, initial encounter for closed fracture: Secondary | ICD-10-CM | POA: Diagnosis present

## 2016-11-08 DIAGNOSIS — Z419 Encounter for procedure for purposes other than remedying health state, unspecified: Secondary | ICD-10-CM

## 2016-11-08 DIAGNOSIS — E8809 Other disorders of plasma-protein metabolism, not elsewhere classified: Secondary | ICD-10-CM

## 2016-11-08 DIAGNOSIS — S72413A Displaced unspecified condyle fracture of lower end of unspecified femur, initial encounter for closed fracture: Secondary | ICD-10-CM | POA: Diagnosis present

## 2016-11-08 DIAGNOSIS — G8918 Other acute postprocedural pain: Secondary | ICD-10-CM | POA: Diagnosis not present

## 2016-11-08 LAB — CBC
HCT: 37 % (ref 36.0–46.0)
Hemoglobin: 12.5 g/dL (ref 12.0–15.0)
MCH: 30.9 pg (ref 26.0–34.0)
MCHC: 33.8 g/dL (ref 30.0–36.0)
MCV: 91.6 fL (ref 78.0–100.0)
PLATELETS: 454 10*3/uL — AB (ref 150–400)
RBC: 4.04 MIL/uL (ref 3.87–5.11)
RDW: 12.6 % (ref 11.5–15.5)
WBC: 9.7 10*3/uL (ref 4.0–10.5)

## 2016-11-08 LAB — CREATININE, SERUM: CREATININE: 0.69 mg/dL (ref 0.44–1.00)

## 2016-11-08 MED ORDER — SORBITOL 70 % SOLN: 30.0000 mL | Freq: Every day | Status: DC | PRN

## 2016-11-08 MED ORDER — VITAMIN D 1000 UNITS PO TABS
4000.0000 [IU] | ORAL_TABLET | Freq: Every day | ORAL | Status: DC
  Administered 2016-11-09 – 2016-11-12 (×4): 4000 [IU] via ORAL
  Filled 2016-11-08 (×5): qty 4

## 2016-11-08 MED ORDER — ACETAMINOPHEN 500 MG PO TABS: 500.0000 mg | ORAL_TABLET | Freq: Four times a day (QID) | ORAL | 0 refills | Status: AC | PRN

## 2016-11-08 MED ORDER — NITROFURANTOIN MONOHYD MACRO 100 MG PO CAPS: 100.0000 mg | ORAL_CAPSULE | Freq: Two times a day (BID) | ORAL | 0 refills | Status: DC

## 2016-11-08 MED ORDER — METHOCARBAMOL 500 MG PO TABS: 500.0000 mg | ORAL_TABLET | Freq: Four times a day (QID) | ORAL | 0 refills | Status: DC | PRN

## 2016-11-08 MED ORDER — DOCUSATE SODIUM 100 MG PO CAPS
100.0000 mg | ORAL_CAPSULE | Freq: Two times a day (BID) | ORAL | Status: DC
  Administered 2016-11-09 – 2016-11-12 (×7): 100 mg via ORAL
  Filled 2016-11-08 (×7): qty 1

## 2016-11-08 MED ORDER — METHOCARBAMOL 500 MG PO TABS
500.0000 mg | ORAL_TABLET | Freq: Four times a day (QID) | ORAL | Status: DC | PRN
  Administered 2016-11-08 – 2016-11-10 (×6): 500 mg via ORAL
  Filled 2016-11-08 (×7): qty 1

## 2016-11-08 MED ORDER — POLYETHYLENE GLYCOL 3350 17 G PO PACK
17.0000 g | PACK | Freq: Every day | ORAL | Status: DC
  Administered 2016-11-09 – 2016-11-10 (×2): 17 g via ORAL
  Filled 2016-11-08 (×2): qty 1

## 2016-11-08 MED ORDER — TRAMADOL HCL 50 MG PO TABS
100.0000 mg | ORAL_TABLET | Freq: Four times a day (QID) | ORAL | Status: DC | PRN
Start: 2016-11-08 — End: 2016-11-12
  Administered 2016-11-10 – 2016-11-12 (×7): 100 mg via ORAL
  Filled 2016-11-08 (×8): qty 2

## 2016-11-08 MED ORDER — BISACODYL 5 MG PO TBEC: 5.0000 mg | DELAYED_RELEASE_TABLET | Freq: Every day | ORAL | Status: DC | PRN

## 2016-11-08 MED ORDER — ENOXAPARIN SODIUM 40 MG/0.4ML ~~LOC~~ SOLN: 40.0000 mg | SUBCUTANEOUS | Status: DC

## 2016-11-08 MED ORDER — NITROFURANTOIN MONOHYD MACRO 100 MG PO CAPS
100.0000 mg | ORAL_CAPSULE | Freq: Two times a day (BID) | ORAL | Status: DC
Start: 2016-11-09 — End: 2016-11-10
  Administered 2016-11-09 – 2016-11-10 (×3): 100 mg via ORAL
  Filled 2016-11-08 (×3): qty 1

## 2016-11-08 MED ORDER — ENOXAPARIN SODIUM 40 MG/0.4ML ~~LOC~~ SOLN: 40.0000 mg | SUBCUTANEOUS | 0 refills | Status: DC

## 2016-11-08 MED ORDER — ENOXAPARIN SODIUM 40 MG/0.4ML ~~LOC~~ SOLN
40.0000 mg | Freq: Every day | SUBCUTANEOUS | Status: DC
  Administered 2016-11-09: 40 mg via SUBCUTANEOUS
  Filled 2016-11-08: qty 0.4

## 2016-11-08 MED ORDER — DOCUSATE SODIUM 100 MG PO CAPS: 100.0000 mg | ORAL_CAPSULE | Freq: Two times a day (BID) | ORAL | 0 refills | Status: AC

## 2016-11-08 MED ORDER — ONDANSETRON HCL 4 MG/2ML IJ SOLN: 4.0000 mg | Freq: Four times a day (QID) | INTRAMUSCULAR | Status: DC | PRN

## 2016-11-08 MED ORDER — ACETAMINOPHEN 325 MG PO TABS
325.0000 mg | ORAL_TABLET | ORAL | Status: DC | PRN
  Administered 2016-11-10 – 2016-11-15 (×16): 650 mg via ORAL
  Filled 2016-11-08 (×16): qty 2

## 2016-11-08 MED ORDER — ONDANSETRON HCL 4 MG PO TABS
4.0000 mg | ORAL_TABLET | Freq: Four times a day (QID) | ORAL | Status: DC | PRN
  Filled 2016-11-08: qty 1

## 2016-11-08 MED ORDER — WHITE PETROLATUM GEL: 1.0000 "application " | Status: DC | PRN

## 2016-11-08 MED ORDER — NITROFURANTOIN MONOHYD MACRO 100 MG PO CAPS
100.0000 mg | ORAL_CAPSULE | Freq: Two times a day (BID) | ORAL | Status: DC
  Administered 2016-11-08 (×2): 100 mg via ORAL
  Filled 2016-11-08 (×2): qty 1

## 2016-11-08 MED ORDER — VITAMIN C 500 MG PO TABS
500.0000 mg | ORAL_TABLET | Freq: Every day | ORAL | Status: DC
  Administered 2016-11-09 – 2016-11-12 (×4): 500 mg via ORAL
  Filled 2016-11-08 (×5): qty 1

## 2016-11-08 MED ORDER — OXYCODONE HCL 5 MG PO TABS: 5.0000 mg | ORAL_TABLET | Freq: Four times a day (QID) | ORAL | 0 refills | Status: DC | PRN

## 2016-11-08 MED ORDER — TRAMADOL HCL 50 MG PO TABS: 100.0000 mg | ORAL_TABLET | Freq: Four times a day (QID) | ORAL | 0 refills | Status: DC | PRN

## 2016-11-08 MED ORDER — OXYCODONE HCL 5 MG PO TABS
5.0000 mg | ORAL_TABLET | ORAL | Status: DC | PRN
  Administered 2016-11-08 – 2016-11-10 (×9): 10 mg via ORAL
  Filled 2016-11-08 (×9): qty 2

## 2016-11-08 MED ORDER — POLYETHYLENE GLYCOL 3350 17 G PO PACK: 17.0000 g | PACK | Freq: Every day | ORAL | 0 refills | Status: AC

## 2016-11-08 NOTE — Progress Notes (Signed)
Insurance has approved an inpt rehab admission. I met with pt and her daughter, [name] White at bedside as well as Ainsley Spinner, PA. Pt is in agreement. I will make the arrangements to admit today. [phone]
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
## 2016-11-08 NOTE — Discharge Instructions (Signed)
[facility] Discharge Instructions   General Discharge Instructions  WEIGHT BEARING STATUS: Nonweightbearing left leg   RANGE OF MOTION/ACTIVITY: unrestricted range of motion left knee   Wound Care: daily wound care as needed. Ok to leave wound open to air. Ok to shower and clean wounds with soap and water   Discharge Wound Care Instructions  Do NOT apply any ointments, solutions or lotions to pin sites or surgical wounds.  These prevent needed drainage and even though solutions like hydrogen peroxide kill bacteria, they also damage cells lining the pin sites that help fight infection.  Applying lotions or ointments can keep the wounds moist and can cause them to breakdown and open up as well. This can increase the risk for infection. When in doubt call the office.  Surgical incisions should be dressed daily.  If any drainage is noted, use one layer of adaptic, then gauze, Kerlix, and an ace wrap.  Once the incision is completely dry and without drainage, it may be left open to air out.  Showering may begin 36-48 hours later.  Cleaning gently with soap and water.  Traumatic wounds should be dressed daily as well.    One layer of adaptic, gauze, Kerlix, then ace wrap.  The adaptic can be discontinued once the draining has ceased    If you have a wet to dry dressing: wet the gauze with saline the squeeze as much saline out so the gauze is moist (not soaking wet), place moistened gauze over wound, then place a dry gauze over the moist one, followed by Kerlix wrap, then ace wrap.  PAIN MEDICATION USE AND EXPECTATIONS  You have likely been given narcotic medications to help control your pain.  After a traumatic event that results in an fracture (broken bone) with or without surgery, it is ok to use narcotic pain medications to help control one's pain.  We understand that everyone responds to pain differently and each individual patient will be evaluated on a regular basis for the  continued need for narcotic medications. Ideally, narcotic medication use should last no more than 6-8 weeks (coinciding with fracture healing).   As a patient it is your responsibility as well to monitor narcotic medication use and report the amount and frequency you use these medications when you come to your office visit.   We would also advise that if you are using narcotic medications, you should take a dose prior to therapy to maximize you participation.  IF YOU ARE ON NARCOTIC MEDICATIONS IT IS NOT PERMISSIBLE TO OPERATE A MOTOR VEHICLE (MOTORCYCLE/CAR/TRUCK/MOPED) OR HEAVY MACHINERY DO NOT MIX NARCOTICS WITH OTHER CNS (CENTRAL NERVOUS SYSTEM) DEPRESSANTS SUCH AS ALCOHOL  Diet: as you were eating previously.  Can use over the counter stool softeners and bowel preparations, such as Miralax, to help with bowel movements.  Narcotics can be constipating.  Be sure to drink plenty of fluids    STOP SMOKING OR USING NICOTINE PRODUCTS!!!!  As discussed nicotine severely impairs your body's ability to heal surgical and traumatic wounds but also impairs bone healing.  Wounds and bone heal by forming microscopic blood vessels (angiogenesis) and nicotine is a vasoconstrictor (essentially, shrinks blood vessels).  Therefore, if vasoconstriction occurs to these microscopic blood vessels they essentially disappear and are unable to deliver necessary nutrients to the healing tissue.  This is one modifiable factor that you can do to dramatically increase your chances of healing your injury.    (This means no smoking, no nicotine gum, patches,  etc)  DO NOT USE NONSTEROIDAL ANTI-INFLAMMATORY DRUGS (NSAID'S)  Using products such as Advil (ibuprofen), Aleve (naproxen), Motrin (ibuprofen) for additional pain control during fracture healing can delay and/or prevent the healing response.  If you would like to take over the counter (OTC) medication, Tylenol (acetaminophen) is ok.  However, some narcotic medications  that are given for pain control contain acetaminophen as well. Therefore, you should not exceed more than 4000 mg of tylenol in a day if you do not have liver disease.  Also note that there are may OTC medicines, such as cold medicines and allergy medicines that my contain tylenol as well.  If you have any questions about medications and/or interactions please ask your doctor/PA or your pharmacist.      ICE AND ELEVATE INJURED/OPERATIVE EXTREMITY  Using ice and elevating the injured extremity above your heart can help with swelling and pain control.  Icing in a pulsatile fashion, such as 20 minutes on and 20 minutes off, can be followed.    Do not place ice directly on skin. Make sure there is a barrier between to skin and the ice pack.    Using frozen items such as frozen peas works well as the conform nicely to the are that needs to be iced.  USE AN ACE WRAP OR TED HOSE FOR SWELLING CONTROL  In addition to icing and elevation, Ace wraps or TED hose are used to help limit and resolve swelling.  It is recommended to use Ace wraps or TED hose until you are informed to stop.    When using Ace Wraps start the wrapping distally (farthest away from the body) and wrap proximally (closer to the body)   Example: If you had surgery on your leg or thing and you do not have a splint on, start the ace wrap at the toes and work your way up to the thigh        If you had surgery on your upper extremity and do not have a splint on, start the ace wrap at your fingers and work your way up to the upper arm  IF YOU ARE IN A SPLINT OR CAST DO NOT REMOVE IT FOR ANY REASON   If your splint gets wet for any reason please contact the office immediately. You may shower in your splint or cast as long as you keep it dry.  This can be done by wrapping in a cast cover or garbage back (or similar)  Do Not stick any thing down your splint or cast such as pencils, money, or hangers to try and scratch yourself with.  If you feel  itchy take benadryl as prescribed on the bottle for itching  IF YOU ARE IN A CAM BOOT (BLACK BOOT)  You may remove boot periodically. Perform daily dressing changes as noted below.  Wash the liner of the boot regularly and wear a sock when wearing the boot. It is recommended that you sleep in the boot until told otherwise  CALL THE OFFICE WITH ANY QUESTIONS OR CONCERNS: [phone]

## 2016-11-08 NOTE — Care Management (Signed)
Case manager spoke with patient concerning insurance approval for Inpatient rehab. [facility] so they will continue to follow patient at discharge from CIR. Vance PeperSusan Davier Tramell, RN BSN Case Manager
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
## 2016-11-08 NOTE — Care Management (Signed)
Case manager received call from [facility] Liaison stating that they can not accept [employer] at this time. Case manager called referral to Katharina Caperrew Wilke, [facility], [facility]. Vance PeperSusan Katora Fini, RN BSN Case Manager
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
## 2016-11-08 NOTE — Progress Notes (Signed)
[facility]. Room currently not ready, but Sherron MondayHillary will call back when clean/ready. Pt updated. Will continue to monitor
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
## 2016-11-09 ENCOUNTER — Inpatient Hospital Stay (HOSPITAL_COMMUNITY): Payer: Medicare Other | Admitting: Occupational Therapy

## 2016-11-09 ENCOUNTER — Inpatient Hospital Stay (HOSPITAL_COMMUNITY): Payer: Medicare Other | Admitting: Physical Therapy

## 2016-11-09 ENCOUNTER — Inpatient Hospital Stay (HOSPITAL_COMMUNITY): Payer: Medicare Other

## 2016-11-09 DIAGNOSIS — E8809 Other disorders of plasma-protein metabolism, not elsewhere classified: Secondary | ICD-10-CM

## 2016-11-09 DIAGNOSIS — R739 Hyperglycemia, unspecified: Secondary | ICD-10-CM

## 2016-11-09 DIAGNOSIS — I82432 Acute embolism and thrombosis of left popliteal vein: Secondary | ICD-10-CM

## 2016-11-09 DIAGNOSIS — S72435S Nondisplaced fracture of medial condyle of left femur, sequela: Secondary | ICD-10-CM

## 2016-11-09 DIAGNOSIS — I158 Other secondary hypertension: Secondary | ICD-10-CM

## 2016-11-09 DIAGNOSIS — S72415S Nondisplaced unspecified condyle fracture of lower end of left femur, sequela: Secondary | ICD-10-CM

## 2016-11-09 LAB — CBC WITH DIFFERENTIAL/PLATELET
BASOS ABS: 0 10*3/uL (ref 0.0–0.1)
Basophils Relative: 0 %
Eosinophils Absolute: 0.4 10*3/uL (ref 0.0–0.7)
Eosinophils Relative: 4 %
HEMATOCRIT: 36 % (ref 36.0–46.0)
HEMOGLOBIN: 12.3 g/dL (ref 12.0–15.0)
LYMPHS PCT: 38 %
Lymphs Abs: 3.5 10*3/uL (ref 0.7–4.0)
MCH: 31.4 pg (ref 26.0–34.0)
MCHC: 34.2 g/dL (ref 30.0–36.0)
MCV: 91.8 fL (ref 78.0–100.0)
MONOS PCT: 11 %
Monocytes Absolute: 1 10*3/uL (ref 0.1–1.0)
NEUTROS ABS: 4.4 10*3/uL (ref 1.7–7.7)
NEUTROS PCT: 47 %
Platelets: 462 10*3/uL — ABNORMAL HIGH (ref 150–400)
RBC: 3.92 MIL/uL (ref 3.87–5.11)
RDW: 12.8 % (ref 11.5–15.5)
WBC: 9.3 10*3/uL (ref 4.0–10.5)

## 2016-11-09 LAB — COMPREHENSIVE METABOLIC PANEL
ALBUMIN: 3.3 g/dL — AB (ref 3.5–5.0)
ALT: 69 U/L — AB (ref 14–54)
AST: 45 U/L — AB (ref 15–41)
Alkaline Phosphatase: 146 U/L — ABNORMAL HIGH (ref 38–126)
Anion gap: 7 (ref 5–15)
BILIRUBIN TOTAL: 0.6 mg/dL (ref 0.3–1.2)
BUN: 11 mg/dL (ref 6–20)
CALCIUM: 9.4 mg/dL (ref 8.9–10.3)
CO2: 28 mmol/L (ref 22–32)
Chloride: 105 mmol/L (ref 101–111)
Creatinine, Ser: 0.73 mg/dL (ref 0.44–1.00)
GFR calc Af Amer: >60 mL/min (ref >60)
GFR calc non Af Amer: >60 mL/min (ref >60)
GLUCOSE: 112 mg/dL — AB (ref 65–99)
Potassium: 4.3 mmol/L (ref 3.5–5.1)
Sodium: 140 mmol/L (ref 135–145)
TOTAL PROTEIN: 6.6 g/dL (ref 6.5–8.1)

## 2016-11-09 LAB — URINE CULTURE: Culture: NO GROWTH

## 2016-11-09 MED ORDER — RIVAROXABAN 15 MG PO TABS
15.0000 mg | ORAL_TABLET | Freq: Two times a day (BID) | ORAL | Status: DC
  Administered 2016-11-09 – 2016-11-10 (×2): 15 mg via ORAL
  Filled 2016-11-09 (×2): qty 1

## 2016-11-09 NOTE — Progress Notes (Signed)
Occupational Therapy Session Note  Patient Details  Name: [name] FullingJanet White MRN: [id number] Date of Birth: [birth year]  Today's Date: 11/09/2016 OT Individual Time: 1600-1630 OT Individual Time Calculation (min): 30 min    Short Term Goals: Week 1:  OT Short Term Goal 1 (Week 1): STGs= LTGS based on short estimated LOS  Skilled Therapeutic Interventions/Progress Updates:    Pt seen for OT session focusing on ADL re-training and caregiver training. Pt sitting up in w/c upon arrival, daughter [name] present.  Education/ demonstration provided for management of w/c part management including elevating leg rests. Pt ambulated into bathroom with CGA, heavy steadying assist while she completed clothing management. Recommended pt's daughter assist with clothing management at this time for increased safety. [name] assisted pt with return to w/c. Made recommendation for stand pivots for transfers if pt is fatigued or pain. Educated throughout session regarding role of OT, CIT , OT goals, and d/c planning.   Therapy Documentation Precautions:  Precautions Precautions: Fall Required Braces or Orthoses: Other Brace/Splint Knee Immobilizer - Left: On when out of bed or walking Other Brace/Splint: hinged knee brace Restrictions Weight Bearing Restrictions: Yes LLE Weight Bearing: Non weight bearing Pain:   No/ denies pain  See Function Navigator for Current Functional Status.   Therapy/Group: Individual Therapy  Lewis, Ednamae Schiano C 11/09/2016, 5:05 PM

## 2016-11-09 NOTE — Progress Notes (Signed)
Occupational Therapy Session Note  Patient Details  Name: [name] White MRN: [id number] Date of Birth: November 14, 1945  Today's Date: 11/09/2016 OT Individual Time: 1335-1405 OT Individual Time Calculation (min): 30 min    Short Term Goals: Week 1:  OT Short Term Goal 1 (Week 1): STGs= LTGS based on short estimated LOS  Skilled Therapeutic Interventions/Progress Updates:  Treatment session with focus on w/c positioning and self-care tasks.  Pt received upright in w/c with reports of pain in LLE due to elevating leg rest positioning.  Modifications made to leg rest as well as setup to increase distal support, with pt reporting improvements for the time being.  Engaged in grooming tasks seated at sink with therapist assisting with washing hair in sink followed by pt brushing hair, requiring assistance from daughter due to pain and fatigue. Pt very verbose, requiring redirection to current task.  Therapy Documentation Precautions:  Precautions Precautions: Fall Required Braces or Orthoses: Other Brace/Splint Knee Immobilizer - Left: On when out of bed or walking Other Brace/Splint: hinged knee brace Restrictions Weight Bearing Restrictions: Yes LLE Weight Bearing: Non weight bearing Pain: Pain Assessment Pain Assessment: 0-10 Pain Score: 7  Pain Type: Surgical pain Pain Location: Knee Pain Orientation: Left Pain Descriptors / Indicators: Aching Pain Onset: On-going Pain Intervention(s): RN made aware;Medication (See eMAR)  See Function Navigator for Current Functional Status.   Therapy/Group: Individual Therapy  Rosalio LoudHOXIE, [name] 11/09/2016, 4:06 PM
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
## 2016-11-09 NOTE — Progress Notes (Signed)
Patient arrived on unit at 9:15 pm. She was accompanied by her daughter. Patient was alert and oriented at the time of arrival. Patient was made comfortable.
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
## 2016-11-09 NOTE — Progress Notes (Signed)
Patient ID: [name] White, female   DOB: 07/24/1946, 70 y.o.   MRN: [id number] Social visit - I met with her and her family to update them on her husband's progress. Burke Thompson, MD, MPH, FACS Trauma: [phone] General Surgery: [phone]  
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
## 2016-11-09 NOTE — Progress Notes (Signed)
*  PRELIMINARY RESULTS* Vascular Ultrasound Lower extremity venous duplex has been completed.  Preliminary findings: DVT noted in the left peroneal and soleal veins. No DVT RLE.   Called report to [facility]M. PA will be made aware.   Farrel DemarkJill Eunice, RDMS, RVT  11/09/2016, 2:45 PM

## 2016-11-09 NOTE — Progress Notes (Signed)
Vascular study today revealed a left peroneal and soleal DVT. Will discontinue Lovenox and begin Xarelto for DVT protocol. Patient can continue therapies as recommended
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
## 2016-11-09 NOTE — Progress Notes (Signed)
Physical Therapy Session Note  Patient Details  Name: [name] White MRN: [id number] Date of Birth: [birth year]  Today's Date: 11/09/2016 PT Individual Time: 1130-1205 PT Individual Time Calculation (min): 35 min   Short Term Goals: Week 1:     Skilled Therapeutic Interventions/Progress Updates: Pt presented in w/c with family present. Pt stating that went to bathroom on own. Instructed in safety sheet and provided family education for safety. Pt c/o increased pain in LLE, adv nsg which administered pain meds during treatment. Instructed in w/c mobilty turns, propulsion, ramps. Required mod assistance for turns which improved with practice.  Performed x2 ramps with minA for pushing over threshold.  Pt able to propel 40f in straightaways with minA.  Pt returned to room and remained in w/c with call bell within reach and all current needs met.      Therapy Documentation Precautions:  Precautions Precautions: Fall Required Braces or Orthoses: Other Brace/Splint Knee Immobilizer - Left: On when out of bed or walking Other Brace/Splint: hinged knee brace Restrictions Weight Bearing Restrictions: Yes LLE Weight Bearing: Non weight bearing  See Function Navigator for Current Functional Status.   Therapy/Group: Individual Therapy  Selwyn Reason  Kellis Mcadam, PTA  11/09/2016, 4:14 PM

## 2016-11-10 ENCOUNTER — Inpatient Hospital Stay (HOSPITAL_COMMUNITY): Payer: Medicare Other | Admitting: Physical Therapy

## 2016-11-10 ENCOUNTER — Inpatient Hospital Stay (HOSPITAL_COMMUNITY): Payer: Medicare Other | Admitting: Occupational Therapy

## 2016-11-10 ENCOUNTER — Inpatient Hospital Stay (HOSPITAL_COMMUNITY): Payer: Medicare Other

## 2016-11-10 DIAGNOSIS — G8918 Other acute postprocedural pain: Secondary | ICD-10-CM

## 2016-11-10 DIAGNOSIS — I82432 Acute embolism and thrombosis of left popliteal vein: Secondary | ICD-10-CM

## 2016-11-10 MED ORDER — OXYCODONE HCL 5 MG PO TABS
5.0000 mg | ORAL_TABLET | ORAL | Status: DC | PRN
  Administered 2016-11-10 – 2016-11-11 (×5): 10 mg via ORAL
  Administered 2016-11-13 – 2016-11-15 (×6): 5 mg via ORAL
  Filled 2016-11-10: qty 2
  Filled 2016-11-10: qty 1
  Filled 2016-11-10 (×3): qty 2
  Filled 2016-11-10 (×2): qty 1
  Filled 2016-11-10 (×2): qty 2
  Filled 2016-11-10 (×2): qty 1

## 2016-11-10 MED ORDER — METHOCARBAMOL 500 MG PO TABS
500.0000 mg | ORAL_TABLET | Freq: Four times a day (QID) | ORAL | Status: DC
  Administered 2016-11-10 – 2016-11-13 (×12): 500 mg via ORAL
  Filled 2016-11-10 (×12): qty 1

## 2016-11-10 MED ORDER — RIVAROXABAN 20 MG PO TABS: 20.0000 mg | ORAL_TABLET | Freq: Every day | ORAL | Status: DC

## 2016-11-10 MED ORDER — RIVAROXABAN 15 MG PO TABS
15.0000 mg | ORAL_TABLET | Freq: Two times a day (BID) | ORAL | Status: DC
  Administered 2016-11-10 – 2016-11-15 (×10): 15 mg via ORAL
  Filled 2016-11-10 (×10): qty 1

## 2016-11-10 MED ORDER — POLYETHYLENE GLYCOL 3350 17 G PO PACK: 17.0000 g | PACK | Freq: Every day | ORAL | Status: DC | PRN

## 2016-11-10 NOTE — Discharge Instructions (Addendum)
Inpatient Rehab Discharge Instructions  Erin FullingJanet Schmieder Discharge date and time: No discharge date for patient encounter.   Activities/Precautions/ Functional Status: Activity: Nonweightbearing left lower extremity with hinged knee brace Diet: regular diet Wound Care: keep wound clean and dry Functional status:  ___ No restrictions     ___ Walk up steps independently ___ 24/7 supervision/assistance   ___ Walk up steps with assistance ___ Intermittent supervision/assistance  ___ Bathe/dress independently ___ Walk with walker     _x__ Bathe/dress with assistance ___ Walk Independently    ___ Shower independently ___ Walk with assistance    ___ Shower with assistance ___ No alcohol     ___ Return to work/school ________    COMMUNITY REFERRALS UPON DISCHARGE:    Home Health:   PT                      Agency:  [facility] Phone: [phone]   Medical Equipment/Items Ordered: all medical equipment from [facility]      Special Instructions:    My questions have been answered and I understand these instructions. I will adhere to these goals and the provided educational materials after my discharge from the hospital.  Patient/Caregiver Signature _______________________________ Date __________  Clinician Signature _______________________________________ Date __________  Please bring this form and your medication list with you to all your follow-up doctor's appointments. Information on my medicine - XARELTO (rivaroxaban)  This medication education was reviewed with me or my healthcare representative as part of my discharge preparation.    WHY WAS XARELTO PRESCRIBED FOR YOU? Xarelto was prescribed to treat blood clots that may have been found in the veins of your legs (deep vein thrombosis) or in your lungs (pulmonary embolism) and to reduce the risk of them occurring again.  What do you need to know about Xarelto? The starting dose is one 15 mg tablet taken  TWICE daily with food for the FIRST 21 DAYS then on 12/01/2016  the dose is changed to one 20 mg tablet taken ONCE A DAY with your evening meal.  DO NOT stop taking Xarelto without talking to the health care provider who prescribed the medication.  Refill your prescription for 20 mg tablets before you run out.  After discharge, you should have regular check-up appointments with your healthcare provider that is prescribing your Xarelto.  In the future your dose may need to be changed if your kidney function changes by a significant amount.  What do you do if you miss a dose? If you are taking Xarelto TWICE DAILY and you miss a dose, take it as soon as you remember. You may take two 15 mg tablets (total 30 mg) at the same time then resume your regularly scheduled 15 mg twice daily the next day.  If you are taking Xarelto ONCE DAILY and you miss a dose, take it as soon as you remember on the same day then continue your regularly scheduled once daily regimen the next day. Do not take two doses of Xarelto at the same time.   Important Safety Information Xarelto is a blood thinner medicine that can cause bleeding. You should call your healthcare provider right away if you experience any of the following: ? Bleeding from an injury or your nose that does not stop. ? Unusual colored urine (red or dark brown) or unusual colored stools (red or black). ? Unusual bruising for unknown reasons. ? A serious fall or if you hit your head (even  if there is no bleeding).  Some medicines may interact with Xarelto and might increase your risk of bleeding while on Xarelto. To help avoid this, consult your healthcare provider or pharmacist prior to using any new prescription or non-prescription medications, including herbals, vitamins, non-steroidal anti-inflammatory drugs (NSAIDs) and supplements.  This website has more information on Xarelto: VisitDestination.com.br.
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
## 2016-11-10 NOTE — Progress Notes (Signed)
Occupational Therapy Session Note  Patient Details  Name: [name] White MRN: [id number] Date of Birth: 12-15-1945  Today's Date: 11/10/2016 OT Individual Time: 1000-1100 OT Individual Time Calculation (min): 60 min   Short Term Goals: Week 1:  OT Short Term Goal 1 (Week 1): STGs= LTGS based on short estimated LOS  Skilled Therapeutic Interventions/Progress Updates: Therapeutic activity with focus on discharge planning, performance of iADL in kitchen at w/c level, AE training, anticipatory awareness, toilet transfer with assist from daughter [place], and brief intro to BUE HEP using theraband.   Pt received seated in w/c and receptive for w/c re-ed training (managing device and self-propulsion).   OT demo'd how to remove leg rests and complete 90 deg turns in small spaces.  Pt then propelled w/c to kitchen and completed re-ed on use of her reacher and facility issued reacher to retrieve items from cabinets.   As daughter [place]any arrived pt, OT and daughter reviewed planned homemaking tasks and kitchen reorganization recommendations to minimize risk of injury.   Pt then self-propelled w/c back to her room and completed toilet transfer retraining with [place] providing min assist with instructional cues.  Safety plan updated.   At end of session OT demo'd BUE HEP using theraband; pt completed scapular chest pull, scapular pull down and chest press seated in w/c.  Written HEP provided.     Therapy Documentation Precautions:  Precautions Precautions: Fall Required Braces or Orthoses: Other Brace/Splint Knee Immobilizer - Left: On when out of bed or walking Other Brace/Splint: hinged knee brace Restrictions Weight Bearing Restrictions: Yes LLE Weight Bearing: Non weight bearing  Pain: Pain Assessment Pain Assessment: 0-10 Pain Score: 6  Pain Type: Acute pain Pain Location: Leg Pain Orientation: Left Pain Descriptors / Indicators: Aching Pain Frequency: Constant Pain Onset:  On-going Patients Stated Pain Goal: 3 Pain Intervention(s): Medication (See eMAR)  See Function Navigator for Current Functional Status.   Therapy/Group: Individual Therapy  Betha Shadix 11/10/2016, 11:34 AM
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
## 2016-11-10 NOTE — Progress Notes (Signed)
Physical Therapy Session Note  Patient Details  Name: [name] FullingJanet Fulginiti MRN: [id number] Date of Birth: 1946/01/15  Today's Date: 11/10/2016 PT Individual Time: 0900-1000 PT Individual Time Calculation (min): 60 min   Short Term Goals: Week 1:     Skilled Therapeutic Interventions/Progress Updates: Pt presented in w/c agreeable to therapy. Performed w/c ambulation in hospital in/out elevators to visit husband in different unit (ICU). Pt required min cues for neogotiating narrow spaces and sharp turns.  Provided pt with w/c gloves as pt with c/o hand discomfort due to arthritis. Pt expressed some relief in using gloves. Provided pt edu in placement of brace as hinge was below MJL. Ambulated 3020ft with RW, maintaining NWB precautions. After ambulation c/o increased RLE pain, nsg notified and pain meds administered. Pt returned to room with handoff to OT.      Therapy Documentation Precautions:  Precautions Precautions: Fall Required Braces or Orthoses: Other Brace/Splint Knee Immobilizer - Left: On when out of bed or walking Other Brace/Splint: hinged knee brace Restrictions Weight Bearing Restrictions: Yes LLE Weight Bearing: Non weight bearing   Pain: Pain Assessment Pain Assessment: 0-10 Pain Score: 5  Pain Type: Acute pain Pain Location: Leg Pain Orientation: Left Pain Descriptors / Indicators: Aching Pain Frequency: Constant Pain Onset: On-going Patients Stated Pain Goal: 3 Pain Intervention(s): Medication (See eMAR)    Therapy/Group: Individual Therapy  Aliyha Fornes  Jamiria Langill, PTA  11/10/2016, 11:55 AM

## 2016-11-11 ENCOUNTER — Inpatient Hospital Stay (HOSPITAL_COMMUNITY): Payer: Medicare Other | Admitting: Occupational Therapy

## 2016-11-11 ENCOUNTER — Inpatient Hospital Stay (HOSPITAL_COMMUNITY): Payer: Medicare Other | Admitting: Physical Therapy

## 2016-11-11 NOTE — Progress Notes (Signed)
Physical Therapy Session Note  Patient Details  Name: [name] White MRN: [id number] Date of Birth: 20-Feb-1946  Today's Date: 11/11/2016 PT Individual Time: 1605-1650 PT Time Calculation: 45 min   Short Term Goals: Week 1:     Skilled Therapeutic Interventions/Progress Updates:    No c/o pain.  Session focus on w/c mobility, transfers with RW, strengthening, and pt/family education.   Pt propels w/c to therapy gym with BUEs for activity tolerance and mobility.  Ambulatory transfer to therapy mat with RW and close supervision.  PT instructed pt in LLE therex 2x15 reps hip abd/add to midline, ankle pumps, and quad sets in addition to 8 modified sit ups to use B elbow prop progress to R elbow prop.  PT switched out pts 18x18 w/c for 16x16 w/c for improved efficiency of propulsion and improved positioning.  PT instructed pt's son in toilet transfers and signed off on safety plan.  Pt left in w/c at end of session with call bell in reach and needs met.   Therapy Documentation Precautions:  Precautions Precautions: Fall Required Braces or Orthoses: Other Brace/Splint Knee Immobilizer - Left: On when out of bed or walking Other Brace/Splint: hinged knee brace Restrictions Weight Bearing Restrictions: Yes LLE Weight Bearing: Non weight bearing   See Function Navigator for Current Functional Status.   Therapy/Group: Individual Therapy  Earnest Conroy Penven-Crew 11/11/2016, 8:53 AM
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
## 2016-11-11 NOTE — Progress Notes (Signed)
[place] PHYSICAL MEDICINE & REHABILITATION     PROGRESS NOTE  Subjective/Complaints:  Pt sitting up in her chair this AM.  She states she slept better.  She states she is doing better with pain and weaning the medications.  She has questions about where to purchase bed railings and a rolling walker.   ROS: Denies CP, SOB, N/V/D.  Objective: Vital Signs: Blood pressure (!) 132/52, pulse 70, temperature 98.4 F (36.9 C), temperature source Oral, resp. rate 17, weight 64.5 kg (142 lb 3.2 oz), SpO2 94 %. No results found.  Recent Labs  11/08/16 2258 11/09/16 0534  WBC 9.7 9.3  HGB 12.5 12.3  HCT 37.0 36.0  PLT 454* 462*    Recent Labs  11/08/16 2258 11/09/16 0534  NA  --  140  K  --  4.3  CL  --  105  GLUCOSE  --  112*  BUN  --  11  CREATININE 0.69 0.73  CALCIUM  --  9.4   CBG (last 3)  No results for input(s): GLUCAP in the last 72 hours.  Wt Readings from Last 3 Encounters:  11/10/16 64.5 kg (142 lb 3.2 oz)  11/08/16 63.9 kg (140 lb 14 oz)    Physical Exam:  BP (!) 132/52 (BP Location: Right Arm)   Pulse 70   Temp 98.4 F (36.9 C) (Oral)   Resp 17   Wt 64.5 kg (142 lb 3.2 oz)   SpO2 94%   BMI 24.41 kg/m  Constitutional: She appears well-developed. NAD. HENT: Normocephalic. Atraumatic.  Eyes: EOMI. No discharge.  Cardiovascular: RRR. No JVD. Respiratory: Effort normal and breath sounds normal.  GI: Soft. Bowel sounds are normal.  Neurological: She is oriented.  Sensation intact to light touch Motor: B/l UE 4+/5 proximal to distal RLE: 4/5 HF, 4+/5 KE, 4+/5 ADF/PF. LLE: HF, KE braced, ADF/PF 4+/5  Skin: Skin is warm and dry.  Incision c/d/i  Psychiatric: She has a normal mood and affect. Her behavior is normal.    Assessment/Plan: 1. Functional deficits secondary to left distal medial femoral condyle fracture which require 3+ hours per day of interdisciplinary therapy in a comprehensive inpatient rehab setting. Physiatrist is providing close team  supervision and 24 hour management of active medical problems listed below. Physiatrist and rehab team continue to assess barriers to discharge/monitor patient progress toward functional and medical goals.  Function:  Bathing Bathing position   Position: Sitting EOB  Bathing parts Body parts bathed by patient: Right arm, Left arm, Chest, Abdomen, Left upper leg, Left lower leg (6/6)    Bathing assist Assist Level: Set up, Touching or steadying assistance(Pt > 75%)   Set up : To obtain items  Upper Body Dressing/Undressing Upper body dressing   What is the patient wearing?: Pull over shirt/dress     Pull over shirt/dress - Perfomed by patient: Thread/unthread right sleeve, Thread/unthread left sleeve, Put head through opening, Pull shirt over trunk          Upper body assist Assist Level: Set up   Set up : To obtain clothing/put away  Lower Body Dressing/Undressing Lower body dressing   What is the patient wearing?: Pants     Pants- Performed by patient: Thread/unthread right pants leg Pants- Performed by helper: Thread/unthread left pants leg, Pull pants up/down                      Lower body assist Assist for lower body dressing:  (max A)  Toileting Toileting Toileting activity did not occur: No continent bowel/bladder event Toileting steps completed by patient: Adjust clothing prior to toileting, Performs perineal hygiene, Adjust clothing after toileting Toileting steps completed by helper: Adjust clothing prior to toileting, Performs perineal hygiene, Adjust clothing after toileting Toileting Assistive Devices: Grab bar or rail  Toileting assist     Transfers Chair/bed transfer     Chair/bed transfer assist level: Touching or steadying assistance (Pt > 75%) Chair/bed transfer assistive device: Patent attorney     Max distance: 20 Assist level: Touching or steadying assistance (Pt > 75%)   Wheelchair   Type: Manual Max  wheelchair distance: 200 Assist Level: Touching or steadying assistance (Pt > 75%)  Cognition Comprehension Comprehension assist level: Follows complex conversation/direction with no assist  Expression Expression assist level: Expresses complex ideas: With no assist  Social Interaction Social Interaction assist level: Interacts appropriately with others - No medications needed.  Problem Solving Problem solving assist level: Solves complex problems: Recognizes & self-corrects  Memory Memory assist level: Complete Independence: No helper    Medical Problem List and Plan: 1.  Decreased functional mobility secondary to left distal medial femoral condyle fracture. Status post ORIF 11/02/2016.  Nonweightbearing 6-8 weeks with hinged knee brace when mobilizing can be off when in bed or sitting in chair. Unrestricted range of motion left knee with immobilizer  Cont CIR, making progress 2.  DVT Prophylaxis/Anticoagulation:  Monitor platelet and any signs of bleeding.    Vascular studies showing DVT , Xarelto started 1/23 3. Pain Management: Oxycodone and Ultram as needed as well as Robaxin as needed  Adjusted pain meds for better control 4. Acute blood loss anemia. Resolved 5. Neuropsych: This patient is capable of making decisions on his own behalf. 6. Skin/Wound Care: Routine skin checks 7. Fluids/Electrolytes/Nutrition: Routine I&Os  BMP within acceptable range on 1/23 8. Constipation. Laxative assistance 9. HTN: Monitor BP with increased activity  Improving 10. Leukocytosis: Resolved 11. Hyperglycemia  Likely reactive 12. Mild Hypoalbuminemia  Will improve with healing and improved diet   LOS (Days) 3 A FACE TO FACE EVALUATION WAS PERFORMED  Ankit Karis Juba 11/11/2016 8:46 AM

## 2016-11-11 NOTE — Progress Notes (Signed)
Occupational Therapy Session Note  Patient Details  Name: [name] FullingJanet Grays MRN: [id number] Date of Birth: Apr 06, 1946  Today's Date: 11/11/2016 OT Individual Time: 1400-1515 OT Individual Time Calculation (min): 75 min   Short Term Goals: Week 1:  OT Short Term Goal 1 (Week 1): STGs= LTGS based on short estimated LOS  Skilled Therapeutic Interventions/Progress Updates:  Pt with 3/10 complaints of pain during session, no medication desired. Therapist monitored pain entire session.  Upon entering room, pt found seated in w/c. Therapist talked with pt regarding OT goals and encouraged pt to think of things she may need to work on before discharging home next Monday; pt mentioned LB ADLs, balance, how to get in/out of her bathroom using RW at home, and pt had questions about washing her hair. Therapist talked with pt on how to get in/out of her BR at home using RW even though it probably will not fit through the doorway. Therapist demonstrated side stepping/hopping and pt then return demonstrated this with min assistance provided by therapist. Therapist talked with pt regarding LB ADLs and compensatory strategies to increase independence and safety with this. Therapist encouraged pt to sit in w/c at sink for washing of hair, NOT standing with RW as pt had originally planned. Pt then  propelled self from room to therapy gym. Pt transferred w/c to edge of mat with supervision (scoot/squat technique). Pt worked on dynamic standing balance/tolerance/endurance standing at mat completing pipe tree. Therapist encouraged pt to take both hands off walker and use both hands to complete standing task to make activity more dynamic and modify activity. Pt with increased anxiety and nervous during this task. Therapist then engaged pt on BUE HEP using 3lb weighted bar; shoulder flexion/extension, shoulder protraction/retraction, shoulder abduction/adduction (2 sets of 10 for each exercise). Pt transferred back into w/c and  propelled self back to her room. At end of session, left pt seated in w/c with all needs within reach.   Therapy Documentation Precautions:  Precautions Precautions: Fall Required Braces or Orthoses: Other Brace/Splint Knee Immobilizer - Left: On when out of bed or walking Other Brace/Splint: hinged knee brace Restrictions Weight Bearing Restrictions: Yes LLE Weight Bearing: Non weight bearing  Vital Signs: Therapy Vitals Temp: 97.8 F (36.6 C) Temp Source: Oral Pulse Rate: 92 Resp: 18 BP: (!) 155/66 Patient Position (if appropriate): Sitting Oxygen Therapy SpO2: 97 % O2 Device: Not Delivered  See Function Navigator for Current Functional Status.   Therapy/Group: Individual Therapy  Edwin CapPatricia Danie Diehl , MS, OTR/L, CLT  11/11/2016, 5:27 PM

## 2016-11-11 NOTE — Progress Notes (Signed)
Occupational Therapy Session Note  Patient Details  Name: [name] FullingJanet White MRN: [id number] Date of Birth: October 22, 1945  Today's Date: 11/11/2016 OT Individual Time: 0800-0900 OT Individual Time Calculation (min): 60 min    Short Term Goals: Week 1:  OT Short Term Goal 1 (Week 1): STGs= LTGS based on short estimated LOS  Skilled Therapeutic Interventions/Progress Updates:    Upon entering room, Pt seated in w/c with cheerful attitude. Pt reports dressing, washing face and toileting  with A from nurse prior to session. Pt brushes teeth at sink with set up to gather items. To improve endurance and UE strength,P propels w/c to apartment with VC for pathfinding and encouragement to continue despite fatigue. During functional mobility, Pt educated on energy conservation strategies, home set up, and was provided with walker bag in prep for upcoming discharge. Pt verbalizes understanding and talks back strategies. Pt transfer w/c > sofa /c supervision. Pt require MIN A sit > stand from sofa level and ambulates /c RW to bed with supervision.  Focus on bed mobility with leg lifter and req short rest break d/t fatigue. Pt propelled w/c to/from day room. Pt did not require cues for safety awareness during functional mobility. Left in room with call bell in room.   Therapy Documentation Precautions:  Precautions Precautions: Fall Required Braces or Orthoses: Other Brace/Splint Knee Immobilizer - Left: On when out of bed or walking Other Brace/Splint: hinged knee brace Restrictions Weight Bearing Restrictions: Yes LLE Weight Bearing: Non weight bearing General:   Vital Signs:  Pain: Pain Assessment Pain Assessment: 0-10 Pain Score: 4  Pain Type: Acute pain Pain Location: Leg Pain Orientation: Left Pain Descriptors / Indicators: Throbbing Pain Frequency: Constant Pain Onset: On-going Patients Stated Pain Goal: 3 Pain Intervention(s): Medication (See eMAR) ADL:   Exercises:   Other  Treatments:    See Function Navigator for Current Functional Status.   Therapy/Group: Individual Therapy  Shon HaleStephanie M Schlosser 11/11/2016, 12:00 PM

## 2016-11-11 NOTE — Progress Notes (Signed)
Physical Therapy Session Note  Patient Details  Name: Erin FullingJanet Winner MRN: [id number] Date of Birth: [birth year]  Today's Date: 11/11/2016 PT Individual Time: 0900-0957 PT Individual Time Calculation (min): 57 min   Short Term Goals: Week 1:  PT Short Term Goal 1 (Week 1): = LTGs due to anticipated LOS   Skilled Therapeutic Interventions/Progress Updates:   Patient in wheelchair upon arrival, c/o 2 to 3/10 soreness in trunk. Patient propelled wheelchair using BUE in controlled environment with mod I and managed right elevating leg rest with supervision. Performed ambulatory transfer using RW to and from simulated car at sedan height with patient scooting backward/forward in long sitting across seat to practice getting in back seat with supervision. Performed NuStep using BUE and RLE only (LLE supported on seat) for cardiovascular endurance at level 5 x 5 min with c/o pain in L hip joint that resolved with rest. Ambulatory transfer using RW to and from mat table with supervision. Patient unable to tolerate supine or semi reclined position with wedge behind so LLE therex performed in long sitting, 3 x 10 reps each exercise: ankle pumps and hip abduction using maxi slide. Gait training using RW x 20 ft with supervision and pt able to maintain NWB LLE. Patient left sitting in wheelchair with all needs within reach.   Therapy Documentation Precautions:  Precautions Precautions: Fall Required Braces or Orthoses: Other Brace/Splint Knee Immobilizer - Left: On when out of bed or walking Other Brace/Splint: hinged knee brace Restrictions Weight Bearing Restrictions: Yes LLE Weight Bearing: Non weight bearing Pain: Pain Assessment Pain Assessment: 0-10 Pain Score: 4  (working with therapy) Pain Type: Acute pain Pain Location: Leg Pain Orientation: Left Pain Descriptors / Indicators: Aching;Discomfort Pain Onset: Gradual Patients Stated Pain Goal: 4 Pain Intervention(s): Medication (See  eMAR)   See Function Navigator for Current Functional Status.   Therapy/Group: Individual Therapy  Kathlynn Swofford, Prudencio PairRebecca A 11/11/2016, 10:27 AM

## 2016-11-12 ENCOUNTER — Inpatient Hospital Stay (HOSPITAL_COMMUNITY): Payer: Medicare Other | Admitting: Occupational Therapy

## 2016-11-12 ENCOUNTER — Inpatient Hospital Stay (HOSPITAL_COMMUNITY): Payer: Medicare Other

## 2016-11-12 DIAGNOSIS — R197 Diarrhea, unspecified: Secondary | ICD-10-CM

## 2016-11-12 MED ORDER — TRAMADOL HCL 50 MG PO TABS
50.0000 mg | ORAL_TABLET | Freq: Four times a day (QID) | ORAL | Status: DC | PRN
  Administered 2016-11-12 – 2016-11-15 (×8): 50 mg via ORAL
  Filled 2016-11-12 (×8): qty 1

## 2016-11-12 MED ORDER — FAMOTIDINE 20 MG PO TABS
20.0000 mg | ORAL_TABLET | Freq: Two times a day (BID) | ORAL | Status: DC
  Administered 2016-11-12 – 2016-11-15 (×7): 20 mg via ORAL
  Filled 2016-11-12 (×7): qty 1

## 2016-11-12 MED ORDER — PROCHLORPERAZINE MALEATE 5 MG PO TABS: 10.0000 mg | ORAL_TABLET | Freq: Four times a day (QID) | ORAL | Status: DC | PRN

## 2016-11-12 NOTE — Progress Notes (Signed)
[facility]     PROGRESS NOTE  Subjective/Complaints:  Pt sitting up in her chair this AM working with therapies.  She states her stomach is upset and has diarrhea after taking all of her AM meds on an empty stomach.  She slept well overnight and states she had a good day yesterday.   ROS: +Stomach upset, diarrhea. Denies CP, SOB.  Objective: Vital Signs: Blood pressure (!) 147/60, pulse 77, temperature 97.8 F (36.6 C), temperature source Oral, resp. rate 18, weight 64.5 kg (142 lb 3.2 oz), SpO2 97 %. No results found. No results for input(s): WBC, HGB, HCT, PLT in the last 72 hours. No results for input(s): NA, K, CL, GLUCOSE, BUN, CREATININE, CALCIUM in the last 72 hours.  Invalid input(s): CO CBG (last 3)  No results for input(s): GLUCAP in the last 72 hours.  Wt Readings from Last 3 Encounters:  11/10/16 64.5 kg (142 lb 3.2 oz)  11/08/16 63.9 kg (140 lb 14 oz)    Physical Exam:  BP (!) 147/60 (BP Location: Right Arm)   Pulse 77   Temp 97.8 F (36.6 C) (Oral)   Resp 18   Wt 64.5 kg (142 lb 3.2 oz)   SpO2 97%   BMI 24.41 kg/m  Constitutional: She appears well-developed. NAD. HENT: Normocephalic. Atraumatic.  Eyes: EOMI. No discharge.  Cardiovascular: RRR. No JVD. Respiratory: Effort normal and breath sounds normal.  GI: Soft. Bowel sounds are normal.  Neurological: She is oriented.  Sensation intact to light touch Motor: B/l UE 4+/5 proximal to distal RLE: 4/5 HF, 4+/5 KE, 4+/5 ADF/PF. LLE: HF, KE braced, ADF/PF 5/5  Skin: Skin is warm and dry. Incision c/d/i  Psychiatric: She has a normal mood and affect. Her behavior is normal.    Assessment/Plan: 1. Functional deficits secondary to left distal medial femoral condyle fracture which require 3+ hours per day of interdisciplinary therapy in a comprehensive inpatient rehab setting. Physiatrist is providing close team supervision and 24 hour management of active medical problems  listed below. Physiatrist and rehab team continue to assess barriers to discharge/monitor patient progress toward functional and medical goals.  Function:  Bathing Bathing position   Position: Sitting EOB  Bathing parts Body parts bathed by patient: Right arm, Left arm, Chest, Abdomen, Left upper leg, Left lower leg (6/6)    Bathing assist Assist Level: Set up, Touching or steadying assistance(Pt > 75%)   Set up : To obtain items  Upper Body Dressing/Undressing Upper body dressing   What is the patient wearing?: Pull over shirt/dress     Pull over shirt/dress - Perfomed by patient: Thread/unthread right sleeve, Thread/unthread left sleeve, Put head through opening, Pull shirt over trunk          Upper body assist Assist Level: Set up   Set up : To obtain clothing/put away  Lower Body Dressing/Undressing Lower body dressing   What is the patient wearing?: Pants     Pants- Performed by patient: Thread/unthread right pants leg Pants- Performed by helper: Thread/unthread left pants leg, Pull pants up/down   Non-skid slipper socks- Performed by helper: Don/doff right sock, Don/doff left sock                  Lower body assist Assist for lower body dressing:  (max A)      Toileting Toileting Toileting activity did not occur: No continent bowel/bladder event Toileting steps completed by patient: Adjust clothing prior to toileting, Performs perineal  hygiene, Adjust clothing after toileting Toileting steps completed by helper: Adjust clothing after toileting Toileting Assistive Devices: Grab bar or rail  Toileting assist Assist level: Supervision or verbal cues   Transfers Chair/bed transfer   Chair/bed transfer method: Ambulatory Chair/bed transfer assist level: Touching or steadying assistance (Pt > 75%) (sofa) Chair/bed transfer assistive device: Armrests, Patent attorneyWalker     Locomotion Ambulation     Max distance: 20 ft Assist level: Supervision or verbal cues    Wheelchair   Type: Manual Max wheelchair distance: 150 ft Assist Level: Supervision or verbal cues  Cognition Comprehension Comprehension assist level: Follows complex conversation/direction with no assist  Expression Expression assist level: Expresses complex ideas: With no assist  Social Interaction Social Interaction assist level: Interacts appropriately with others - No medications needed.  Problem Solving Problem solving assist level: Solves complex problems: Recognizes & self-corrects  Memory Memory assist level: Complete Independence: No helper    Medical Problem List and Plan: 1.  Decreased functional mobility secondary to left distal medial femoral condyle fracture. Status post ORIF 11/02/2016.  Nonweightbearing 6-8 weeks with hinged knee brace when mobilizing can be off when in bed or sitting in chair. Unrestricted range of motion left knee with immobilizer  Cont CIR, plan for d/c Monday 2.  DVT Prophylaxis/Anticoagulation:  Monitor platelet and any signs of bleeding.    Vascular studies showing DVT , Xarelto started 1/23  Labs ordered for tomorrow 3. Pain Management: Oxycodone and Ultram as needed as well as Robaxin as needed  Adjusted pain meds for better control 4. Acute blood loss anemia. Resolved 5. Neuropsych: This patient is capable of making decisions on his own behalf. 6. Skin/Wound Care: Routine skin checks 7. Fluids/Electrolytes/Nutrition: Routine I&Os  BMP within acceptable range on 1/23 8. Constipation. Laxative assistance 9. HTN: Monitor BP with increased activity  Elevated, likely reactive, will need follow up as outpt for ambulatory monitoring and consideration for meds 10. Leukocytosis: Resolved 11. Hyperglycemia  Likely reactive 12. Mild Hypoalbuminemia  Will improve with healing and improved diet   LOS (Days) 4 A FACE TO FACE EVALUATION WAS PERFORMED  [name] White [name] White 11/12/2016 8:22 AM

## 2016-11-12 NOTE — Progress Notes (Signed)
Physical Therapy Discharge Summary  Patient Details  Name: Loneta Tamplin MRN: [id number] Date of Birth: [birth year]  Today's Date: 11/14/2016  Patient has met 8 of 8 long term goals due to improved activity tolerance, improved balance, increased strength, decreased pain and ability to compensate for deficits.  Patient to discharge at modified independent w/c level and mod I household ambulation with RW.  Patient's care partner is independent to provide the necessary physical and set-up assistance at discharge.  Reasons goals not met: n/a  Recommendation:  Patient will benefit from ongoing skilled PT services in home health setting to continue to advance safe functional mobility, address ongoing impairments in gait, strength, ROM, balance, endurance, functional mobility, and minimize fall risk.  Equipment: RW. Pt already issued w/c & 3-in-1 BSC. Instructed pt on need to purchase leg lifter, sock aid, & reacher in gift shop.   Reasons for discharge: treatment goals met and discharge from hospital  Patient/family agrees with progress made and goals achieved: Yes  PT Discharge Precautions/Restrictions Precautions Required Braces or Orthoses: Other Brace/Splint Knee Immobilizer - Left: On when out of bed or walking Other Brace/Splint: hinged knee brace Restrictions LLE Weight Bearing: Non weight bearing Other Position/Activity Restrictions: Unrestricted ROM per ortho MD; Brace OK to be off when in bed or chair.  Pain Pt denies pain at rest but reports spasms in LLE when attempting bed mobility (leg in dependent position).  Vision/Perception  Pt does not wear glasses. Pt denies any changes in vision since admission to CIR.  Cognition Orientation Level: Oriented X4   Sensation Sensation Light Touch: Appears Intact (BLE) Proprioception: Appears Intact (BLE) Coordination Gross Motor Movements are Fluid and Coordinated: Yes  Motor  Motor Motor - Skilled Clinical Observations:  general weakness   Mobility Bed Mobility Bed Mobility: Sit to Supine;Supine to Sit Supine to Sit: 6: Modified independent (Device/Increase time) (with leg lifter) Sit to Supine: 6: Modified independent (Device/Increase time) (with leg lifter) Transfers Transfers: Yes Sit to Stand: 6: Modified independent (Device/Increase time);With armrests Stand to Sit: 6: Modified independent (Device/Increase time);With armrests  Locomotion  Ambulation Ambulation: Yes Ambulation/Gait Assistance: 6: Modified independent (Device/Increase time) Ambulation Distance (Feet): 45 Feet Assistive device: Rolling walker Gait Gait: Yes Gait Pattern: Step-to pattern;Decreased step length - right;Decreased stride length (decreased gait speed, NWB LLE) Stairs / Additional Locomotion Stairs: No Wheelchair Mobility Wheelchair Mobility: Yes Wheelchair Assistance: 6: Modified independent (Device/Increase time) Environmental health practitioner: Both upper extremities Wheelchair Parts Management: Independent Distance: 150 ft   Balance Balance Balance Assessed: Yes Dynamic Standing Balance Dynamic Standing - Comments: Mod I with RW   Extremity Assessment  RLE Assessment RLE Assessment: Within Functional Limits LLE Assessment LLE Assessment:  (knee brace when OOB)   See Function Navigator for Current Functional Status.  Waunita Schooner, PT, DPT Lars Masson, PT, DPT 11/14/2016, 7:35 PM

## 2016-11-12 NOTE — Progress Notes (Signed)
Social Work Patient ID: [name] White, female   DOB: Nov 28, 1945, 71 y.o.   MRN: [id number]   Met with pt yesterday to review team conference.  Also, spoke with son, [place], via phone.  Both aware and agreeable with targeted d/c date of 1/29 and mod independent goals overall.  Reviewed DME pt already received on acute.  Discussed recommended follow up.  Continue to follow.  Aaryanna Hyden, LCSW

## 2016-11-12 NOTE — Progress Notes (Signed)
Occupational Therapy Session Note  Patient Details  Name: [name] White MRN: [id number] Date of Birth: April 10, 1946  Today's Date: 11/12/2016 OT Individual Time: 1400-1430 OT Individual Time Calculation (min): 30 min    Short Term Goals: No short term goals set  Skilled Therapeutic Interventions/Progress Updates:    Pt seen this session to focus on education with pt on self ROM of LLE. Pt transferred to mat with RW with S. She doffed brace with cues to use leg lifter with R hand and pull brace away with left hand.  She worked on gentle PROM of knee flexion using B hands and AROM of ankle and rotation of L thigh to work on Systems developerquad strength. Pt tolerated exercises well. Pt's PT arrived for her next session.  Therapy Documentation Precautions:  Precautions Precautions: Fall Required Braces or Orthoses: Other Brace/Splint Knee Immobilizer - Left: On when out of bed or walking Other Brace/Splint: hinged knee brace Restrictions Weight Bearing Restrictions: Yes LLE Weight Bearing: Non weight bearing  Pain: Pain Assessment Pain Score: 4  ADL:    See Function Navigator for Current Functional Status.   Therapy/Group: Individual Therapy  Joene Gelder 11/12/2016, 12:04 PM

## 2016-11-12 NOTE — Progress Notes (Signed)
Physical Therapy Session Note  Patient Details  Name: [name] White MRN: [id number] Date of Birth: 07/13/1946  Today's Date: 11/12/2016 PT Individual Time: 0758-0900 PT Individual Time Calculation (min): 62 min   Short Term Goals: Week 1:  PT Short Term Goal 1 (Week 1): = LTGs due to anticipated LOS  Skilled Therapeutic Interventions/Progress Updates:    Handoff from NT to assist patient into bathroom; supervision level with RW for gait in and out of bathroom and for transfers, maintaining NWB status without cues. Repeated again a second time due to upset stomach and reports of some nausea as well. Ginger ale offered and RN and MD aware. Education in regards to discharge planning, home set-up and assist as well as problem solving mobility in the home, and progression of mobility upon discharge. Pt discussed her trying to cope with her own injuries as well as her husbands - she reports she has been visiting him daily. Emotional support provided throughout session. Pt completed grooming and hygiene at sink in standing position at supervision level. Discussed goals of modified independent and increasing independence at home as well as planning ahead for meals and the day to conserve energy and allow her to be able to do more independently if children have to work, etc. Pt verbalized understanding and in agreement.   Therapy Documentation Precautions:  Precautions Precautions: Fall Required Braces or Orthoses: Other Brace/Splint Knee Immobilizer - Left: On when out of bed or walking Other Brace/Splint: hinged knee brace Restrictions Weight Bearing Restrictions: Yes LLE Weight Bearing: Non weight bearing   Pain: C/o upset stomach this AM after taking meds on empty stomach. Pt reports overall not feeling well today.    See Function Navigator for Current Functional Status.   Therapy/Group: Individual Therapy  Karolee StampsGray, Briyanna Billingham Darrol PokeBrescia  Morrissa Shein B. Amardeep Beckers, PT, DPT  11/12/2016, 9:19 AM

## 2016-11-12 NOTE — Progress Notes (Signed)
Physical Therapy Session Note  Patient Details  Name: [name] White MRN: [id number] Date of Birth: [birth year]  Today's Date: 11/12/2016 PT Individual Time: 1430-1530 PT Individual Time Calculation (min): 60 min   Short Term Goals: Week 1:  PT Short Term Goal 1 (Week 1): = LTGs due to anticipated LOS  Skilled Therapeutic Interventions/Progress Updates:    Handoff from OT. Reviewed ROM of L knee and spent time educating patient on LE therex for RLE as well as on LLE with handouts issued. Pt able to reach about 20-30 degrees of knee flexion. Demonstrated exercises in both supine, seated and standing positions. Pt verbalized understanding and demonstrated donning hinged knee brace with initial assist to place underneath leg. D/c planning discussed throughout session in regards to home management (cooking tasks, making a schedule of activities, energy conservation, etc). Pt performed transfers at overall supervision level and w/c mobility modified independent on unit. Handoff to RN at end of session for pain medication.   Therapy Documentation Precautions:  Precautions Precautions: Fall Required Braces or Orthoses: Other Brace/Splint Knee Immobilizer - Left: On when out of bed or walking Other Brace/Splint: hinged knee brace Restrictions Weight Bearing Restrictions: Yes LLE Weight Bearing: Non weight bearing Other Position/Activity Restrictions: Unrestricted ROM per ortho MD; Brace OK to be off when in bed or chair.   Pain: Pain Assessment Pain Assessment: 0-10 Pain Score: 6  Pain Type: Acute pain Pain Location: Leg Pain Orientation: Left Pain Descriptors / Indicators: Aching Pain Frequency: Constant Pain Onset: On-going Patients Stated Pain Goal: 3 Pain Intervention(s): Medication (See eMAR)  See Function Navigator for Current Functional Status.   Therapy/Group: Individual Therapy  Karolee StampsGray, Caresse Sedivy Darrol PokeBrescia  Ronnie Mallette B. Yarielis Funaro, PT, DPT  11/12/2016, 3:47 PM

## 2016-11-12 NOTE — Care Management Note (Signed)
[facility] Individual Statement of Services  Patient Name:  [name] White FullingJanet Buskirk  Date:  11/11/2016  Welcome to the [facility].  Our goal is to provide you with an individualized program based on your diagnosis and situation, designed to meet your specific needs.  With this comprehensive rehabilitation program, you will be expected to participate in at least 3 hours of rehabilitation therapies Monday-Friday, with modified therapy programming on the weekends.  Your rehabilitation program will include the following services:  Physical Therapy (PT), Occupational Therapy (OT), Speech Therapy (ST), 24 hour per day rehabilitation nursing, Therapeutic Recreaction (TR), Neuropsychology, Case Management (Social Worker), Rehabilitation Medicine, Nutrition Services and Pharmacy Services  Weekly team conferences will be held on Wednesdays to discuss your progress.  Your Social Worker will talk with you frequently to get your input and to update you on team discussions.  Team conferences with you and your family in attendance may also be held.  Expected length of stay: 7 days  Overall anticipated outcome: mod independent  Depending on your progress and recovery, your program may change. Your Social Worker will coordinate services and will keep you informed of any changes. Your Social Worker's name and contact numbers are listed  below.  The following services may also be recommended but are not provided by the [facility]:   Driving Evaluations  Home Health Rehabiltiation Services  Outpatient Rehabilitation Services    Arrangements will be made to provide these services after discharge if needed.  Arrangements include referral to agencies that provide these services.  Your insurance has been [facility] to be:  [facility] Your primary doctor is:  Dr. Juleen ChinaKohut  Pertinent information will be shared with your doctor and your insurance  company.  Social Worker:  RingstedLucy Yehudis Monceaux, [place] [phone] or ([phone]) [phone]   Information discussed with and copy given to patient by: Amada JupiterHOYLE, Anasha Perfecto, 11/11/2016, 2:07 PM

## 2016-11-12 NOTE — Progress Notes (Signed)
Occupational Therapy Session Note  Patient Details  Name: [name] FullingJanet White MRN: [id number] Date of Birth: 1945-10-20  Today's Date: 11/12/2016 OT Individual Time: 1121-1206 OT Individual Time Calculation (min): 45 min    Short Term Goals: Week 1:  OT Short Term Goal 1 (Week 1): STGs= LTGS based on short estimated LOS  Skilled Therapeutic Interventions/Progress Updates: Pt was sitting up in w/c at time of arrival with daughter Kennith Centerracey present. Pt had just visited her husband on ICU and expressed concern with his progress. Pt provided therapeutic listening and support for psychosocial wellness. Afterwards, collaboration with Kennith Centerracey and pt completed regarding home bathroom setup. They reported that this was a pressing concern of theirs, and wanted to address it again in therapy (she also practiced yesterday in OT). Kennith Centerracey was provided with home measurement sheet and called    family at home to provide actual measurements of bathroom doorway width and ambulating space in front of toilet. Pt self propelled to therapy apartment and practiced side stepping in and out of bathroom simulating home setup with RW and Min A. Discussed using collapsible foot stool in home to keep L LE elevated when voiding. Afterwards pt self propelled back to room. Family asked questions regarding ROM restrictions of L LE. Per MD note, pt has unrestricted ROM of L LE, can doff when sitting in w/c or in bed. Pt can also shower. Pt and family were educated on this with verbalized understanding. Pt was left with family at time of departure.   Therapy Documentation Precautions:  Precautions Precautions: Fall Required Braces or Orthoses: Other Brace/Splint Knee Immobilizer - Left: On when out of bed or walking Other Brace/Splint: hinged knee brace Restrictions Weight Bearing Restrictions: Yes LLE Weight Bearing: Non weight bearing  Pain: Pt medicated during session    ADL:      See Function Navigator for Current Functional  Status.   Therapy/Group: Individual Therapy  Lauri Purdum A Ingram Onnen 11/12/2016, 12:39 PM

## 2016-11-13 ENCOUNTER — Inpatient Hospital Stay (HOSPITAL_COMMUNITY): Payer: Medicare Other | Admitting: Occupational Therapy

## 2016-11-13 ENCOUNTER — Inpatient Hospital Stay (HOSPITAL_COMMUNITY): Payer: Medicare Other | Admitting: Physical Therapy

## 2016-11-13 ENCOUNTER — Encounter (HOSPITAL_COMMUNITY): Payer: Medicare Other | Admitting: *Deleted

## 2016-11-13 DIAGNOSIS — M25562 Pain in left knee: Secondary | ICD-10-CM

## 2016-11-13 LAB — CBC WITH DIFFERENTIAL/PLATELET
BASOS PCT: 1 %
Basophils Absolute: 0.1 10*3/uL (ref 0.0–0.1)
EOS ABS: 0.3 10*3/uL (ref 0.0–0.7)
EOS PCT: 3 %
HEMATOCRIT: 39.3 % (ref 36.0–46.0)
HEMOGLOBIN: 13.1 g/dL (ref 12.0–15.0)
LYMPHS PCT: 34 %
Lymphs Abs: 3.9 10*3/uL (ref 0.7–4.0)
MCH: 31 pg (ref 26.0–34.0)
MCHC: 33.3 g/dL (ref 30.0–36.0)
MCV: 93.1 fL (ref 78.0–100.0)
MONOS PCT: 9 %
Monocytes Absolute: 1 10*3/uL (ref 0.1–1.0)
NEUTROS ABS: 6.3 10*3/uL (ref 1.7–7.7)
Neutrophils Relative %: 53 %
Platelets: 637 10*3/uL — ABNORMAL HIGH (ref 150–400)
RBC: 4.22 MIL/uL (ref 3.87–5.11)
RDW: 12.8 % (ref 11.5–15.5)
WBC: 11.6 10*3/uL — ABNORMAL HIGH (ref 4.0–10.5)

## 2016-11-13 MED ORDER — VITAMIN D 1000 UNITS PO TABS
4000.0000 [IU] | ORAL_TABLET | Freq: Every day | ORAL | Status: DC
  Administered 2016-11-13 – 2016-11-14 (×2): 4000 [IU] via ORAL
  Filled 2016-11-13: qty 4

## 2016-11-13 MED ORDER — VITAMIN C 500 MG PO TABS
500.0000 mg | ORAL_TABLET | Freq: Every day | ORAL | Status: DC
  Administered 2016-11-13 – 2016-11-14 (×2): 500 mg via ORAL
  Filled 2016-11-13: qty 1

## 2016-11-13 MED ORDER — METHOCARBAMOL 500 MG PO TABS
500.0000 mg | ORAL_TABLET | Freq: Four times a day (QID) | ORAL | Status: DC
  Administered 2016-11-13 – 2016-11-15 (×8): 500 mg via ORAL
  Filled 2016-11-13 (×8): qty 1

## 2016-11-13 NOTE — Progress Notes (Signed)
[name] White is a 71 y.o. female 11-28-1945 [id number]  Subjective: C/o worsened spastic pain in the R leg w/spasms this am (8 out of 10). The pt relates it to not getting her Robaxin q 6 hrs...  Objective: Vital signs in last 24 hours: Temp:  [97.5 F (36.4 C)-98.4 F (36.9 C)] 98.4 F (36.9 C) (01/27 0612) Pulse Rate:  [82-84] 82 (01/27 0612) Resp:  [17-18] 17 (01/27 0612) BP: (145-153)/(68-76) 145/68 (01/27 0612) SpO2:  [96 %] 96 % (01/27 0612) Weight change:  Last BM Date: 11/12/16  Intake/Output from previous day: 01/26 0701 - 01/27 0700 In: 480 [P.O.:480] Out: -  Last cbgs: CBG (last 3)  No results for input(s): GLUCAP in the last 72 hours.   Physical Exam General: in apparent distress due to pain HEENT: not dry Lungs: Normal effort. Lungs clear to auscultation, no crackles or wheezes. Cardiovascular: Regular rate and rhythm, no leg edema Abdomen: S/NT/ND; BS(+) Musculoskeletal:  unchanged Neurological: No new neurological deficits    Skin: clear   Mental state: Alert, oriented, cooperative - tearful    Lab Results: BMET    Component Value Date/Time   NA 140 11/09/2016 0534   K 4.3 11/09/2016 0534   CL 105 11/09/2016 0534   CO2 28 11/09/2016 0534   GLUCOSE 112 (H) 11/09/2016 0534   BUN 11 11/09/2016 0534   CREATININE 0.73 11/09/2016 0534   CALCIUM 9.4 11/09/2016 0534   GFRNONAA >60 11/09/2016 0534   GFRAA >60 11/09/2016 0534   CBC    Component Value Date/Time   WBC 9.3 11/09/2016 0534   RBC 3.92 11/09/2016 0534   HGB 12.3 11/09/2016 0534   HCT 36.0 11/09/2016 0534   PLT 462 (H) 11/09/2016 0534   MCV 91.8 11/09/2016 0534   MCH 31.4 11/09/2016 0534   MCHC 34.2 11/09/2016 0534   RDW 12.8 11/09/2016 0534   LYMPHSABS 3.5 11/09/2016 0534   MONOABS 1.0 11/09/2016 0534   EOSABS 0.4 11/09/2016 0534   BASOSABS 0.0 11/09/2016 0534    Studies/Results: No results found.  Medications: I have reviewed the patient's current  medications.  Assessment/Plan:   1. L distal femur fx - healing 2. DVT proph - on Xarelto 3. Pain Management: Oxycodone and Ultram as needed.  Robaxin - change to q6 h due to pain 4. Acute blood loss anemia. Monitor CBC 5. Neuropsych: This patient is capable of making decisions on his own behalf. 6. Skin/Wound Care: OK 7. Constipation. Laxative assistance prn 8. HTN: Monitor BP with increased activity. BP - ok 9. Leukocytosis: Resolved 10. Hyperglycemia 11. Mild Hypoalbuminemia - good diet    Length of stay, days: 5  Sonda PrimesAlex Plotnikov , MD 11/13/2016, 8:59 AM

## 2016-11-13 NOTE — Progress Notes (Signed)
Physical Therapy Session Note  Patient Details  Name: [name] White MRN: [id number] Date of Birth: Dec 15, 1945  Today's Date: 11/13/2016 PT Individual Time: 1000-1100 PT Individual Time Calculation (min): 60 min   Short Term Goals: Week 1:  PT Short Term Goal 1 (Week 1): = LTGs due to anticipated LOS  Skilled Therapeutic Interventions/Progress Updates: Pt presented in bed with c/o increased pain/spasm in LLE which has decreased since earlier as had received pain meds. Pt able to perform supine to sit at EOB with additional time and cues for pain management with activity. Pt ambulated to toilet with RW and able to doff pants with supervision. Pt able to perform standing balance at sink while brushing teeth and washing face maintaining Fair + balance. Pt edu on repositioning in bed for pain management. Pt donned shoe and propelled 130f with supervision. Ambulated with shoe 388fwith supervision. Pt propelled self back to room and left with call bell within reach and all current needs met.      Therapy Documentation Precautions:  Precautions Precautions: Fall Required Braces or Orthoses: Other Brace/Splint Knee Immobilizer - Left: On when out of bed or walking Other Brace/Splint: hinged knee brace Restrictions Weight Bearing Restrictions: Yes LLE Weight Bearing: Non weight bearing Other Position/Activity Restrictions: Unrestricted ROM per ortho MD; Brace OK to be off when in bed or chair.   See Function Navigator for Current Functional Status.   Therapy/Group: Individual Therapy  Armond Cuthrell  Allecia Bells, PTA  11/13/2016, 12:15 PM

## 2016-11-13 NOTE — Plan of Care (Signed)
Problem: RH PAIN MANAGEMENT Goal: RH STG PAIN MANAGED AT OR BELOW PT'S PAIN GOAL <2 on the scale 0-10  Outcome: Not Progressing Pain levels above 2

## 2016-11-13 NOTE — Progress Notes (Signed)
Physical Therapy Session Note  Patient Details  Name: [name] White MRN: [id number] Date of Birth: 1946-05-01  Today's Date: 11/13/2016 PT Individual Time: 1430-1530     Short Term Goals: Week 1:  PT Short Term Goal 1 (Week 1): = LTGs due to anticipated LOS  Skilled Therapeutic Interventions/Progress Updates: Pt participated in group session. Participated in seated and LE therex including LAQ, standing SLR, standing abd to fatigue. Performed seated UE therex with 2# dowel for increased endurance with w/c mobility. Also performed standing balance activities with other participant with pt maintaining fair (+) balance with single upper extremity support. Pt with good participation throughout group session.      Therapy Documentation Precautions:  Precautions Precautions: Fall Required Braces or Orthoses: Other Brace/Splint Knee Immobilizer - Left: On when out of bed or walking Other Brace/Splint: hinged knee brace Restrictions Weight Bearing Restrictions: Yes LLE Weight Bearing: Non weight bearing Other Position/Activity Restrictions: Unrestricted ROM per ortho MD; Brace OK to be off when in bed or chair.   See Function Navigator for Current Functional Status.   Therapy/Group: Group Therapy  Jacobb Alen 11/13/2016, 4:26 PM

## 2016-11-13 NOTE — Progress Notes (Signed)
Occupational Therapy Session Note  Patient Details  Name: [name] FullingJanet White MRN: [id number] Date of Birth: 1946-05-29  Today's Date: 11/13/2016 OT Individual Time: [id number] OT Individual Time Calculation (min): 32 min    Short Term Goals: Week 1:  OT Short Term Goal 1 (Week 1): STGs= LTGS based on short estimated LOS  Skilled Therapeutic Interventions/Progress Updates: Pt was lying in bed at time of arrival, reported very severe pain in L LE and was tearful. RN made aware, plans made to see pt later in day when feeling better.   Pt seen prior to lunch, sitting in w/c  with family present. Reported feeling much better and agreeable to tx. She practiced hair washing standing and sitting as needed at sink. Activity simulated home environment and discussed home modifications to optimize safety. Also educated on having daughter present while completing task with verbalized understanding. Pt able to complete standing portion while adhering to NWB precautions. She then completed hair blow drying while seated at sink. Afterwards pt was left in w/c with all needs within reach.   2nd Session 1:1 tx (46 minutes)   Pt received in ortho gym via PT handoff. Discussed further ADL needs/routine planning for optimal safety at discharge. Pt provided with LH sponge for ADLs tomorrow for washing L LE. Pt reports that shower is on 2nd floor of home and she is planning to sponge bathe only. Pt requested to go over kitchen mobility/safety during session. She self propelled to therapy apartment with OT. Recommended w/c lap tray for transporting kitchen items. Discussed kitchen modifications for w/c level meal prep as well as w/c and RW positioning when completing kitchen tasks. Pt asked many questions and was very receptive to education and feedback. Afterwards pt self propelled back to room and was left with all needs within reach and family member present.    Therapy Documentation Precautions:   Precautions Precautions: Fall Required Braces or Orthoses: Other Brace/Splint Knee Immobilizer - Left: On when out of bed or walking Other Brace/Splint: hinged knee brace Restrictions Weight Bearing Restrictions: Yes LLE Weight Bearing: Non weight bearing Other Position/Activity Restrictions: Unrestricted ROM per ortho MD; Brace OK to be off when in bed or chair.  Pain: c/o pain in 1st session with RN made aware and tx held until pain subsided    ADL:  :    See Function Navigator for Current Functional Status.   Therapy/Group: Individual Therapy  Collin Hendley A Duncan Alejandro 11/13/2016, 12:42 PM

## 2016-11-14 ENCOUNTER — Inpatient Hospital Stay (HOSPITAL_COMMUNITY): Payer: Medicare Other | Admitting: Occupational Therapy

## 2016-11-14 ENCOUNTER — Inpatient Hospital Stay (HOSPITAL_COMMUNITY): Payer: Medicare Other | Admitting: Physical Therapy

## 2016-11-14 NOTE — Progress Notes (Signed)
[name] FullingJanet White is a 71 y.o. female 15-Jul-1946 [id number]  Subjective: No new complaints; LLE pain is better. No new problems. Slept well. Feeling OK.  Objective: Vital signs in last 24 hours: Temp:  [98.4 F (36.9 C)-98.8 F (37.1 C)] 98.4 F (36.9 C) (01/28 11910608) Pulse Rate:  [74-92] 74 (01/28 0608) Resp:  [17-19] 17 (01/28 0608) BP: (130-132)/(58-76) 130/58 (01/28 0608) SpO2:  [96 %-99 %] 96 % (01/28 47820608) Weight change:  Last BM Date: 11/13/16  Intake/Output from previous day: 01/27 0701 - 01/28 0700 In: 480 [P.O.:480] Out: -  Last cbgs: CBG (last 3)  No results for input(s): GLUCAP in the last 72 hours.   Physical Exam General: No apparent distress   HEENT: not dry Lungs: Normal effort. Lungs clear to auscultation, no crackles or wheezes. Cardiovascular: Regular rate and rhythm, no edema Abdomen: S/NT/ND; BS(+) Musculoskeletal:  Unchanged - LLE is in a brace Neurological: No new neurological deficits Wounds: dressed   Skin: clear  Aging changes Mental state: Alert, oriented, cooperative    Lab Results: BMET    Component Value Date/Time   NA 140 11/09/2016 0534   K 4.3 11/09/2016 0534   CL 105 11/09/2016 0534   CO2 28 11/09/2016 0534   GLUCOSE 112 (H) 11/09/2016 0534   BUN 11 11/09/2016 0534   CREATININE 0.73 11/09/2016 0534   CALCIUM 9.4 11/09/2016 0534   GFRNONAA >60 11/09/2016 0534   GFRAA >60 11/09/2016 0534   CBC    Component Value Date/Time   WBC 11.6 (H) 11/13/2016 1106   RBC 4.22 11/13/2016 1106   HGB 13.1 11/13/2016 1106   HCT 39.3 11/13/2016 1106   PLT 637 (H) 11/13/2016 1106   MCV 93.1 11/13/2016 1106   MCH 31.0 11/13/2016 1106   MCHC 33.3 11/13/2016 1106   RDW 12.8 11/13/2016 1106   LYMPHSABS 3.9 11/13/2016 1106   MONOABS 1.0 11/13/2016 1106   EOSABS 0.3 11/13/2016 1106   BASOSABS 0.1 11/13/2016 1106    Studies/Results: No results found.  Medications: I have reviewed the patient's current medications.   A/P:  1. Distal  femur fracture healing - continue with therapy 2. DVT prophylaxis on Xarelto 3. Leg pain continue with oxycodone and Ultram as needed. Continue with Robaxin 4. Anemia - monitor CBC 5. Constipation-laxative of choice 6. Hypertension area and continue to monitor blood pressure    Length of stay, days: 6  Sonda PrimesAlex Algernon Mundie , MD 11/14/2016, 9:22 AM

## 2016-11-14 NOTE — Discharge Summary (Signed)
Discharge summary job # [id number]

## 2016-11-14 NOTE — Plan of Care (Signed)
Problem: RH Balance Goal: LTG Patient will maintain dynamic standing balance (PT) LTG:  Patient will maintain dynamic standing balance with assistance during mobility activities (PT)  Outcome: Completed/Met Date Met: 11/14/16 With RW  Problem: RH Bed Mobility Goal: LTG Patient will perform bed mobility with assist (PT) LTG: Patient will perform bed mobility with assistance, with/without cues (PT).  Outcome: Completed/Met Date Met: 11/14/16 With leg lifter  Problem: RH Bed to Chair Transfers Goal: LTG Patient will perform bed/chair transfers w/assist (PT) LTG: Patient will perform bed/chair transfers with assistance, with/without cues (PT).  Outcome: Completed/Met Date Met: 11/14/16 With RW  Problem: RH Car Transfers Goal: LTG Patient will perform car transfers with assist (PT) LTG: Patient will perform car transfers with assistance (PT).  Outcome: Completed/Met Date Met: 11/14/16 With RW  Problem: RH Ambulation Goal: LTG Patient will ambulate in controlled environment (PT) LTG: Patient will ambulate in a controlled environment, # of feet with assistance (PT).  Outcome: Completed/Met Date Met: 11/14/16 45 ft withRW Goal: LTG Patient will ambulate in home environment (PT) LTG: Patient will ambulate in home environment, # of feet with assistance (PT).  Outcome: Completed/Met Date Met: 11/14/16 15 ft with RW  Problem: RH Wheelchair Mobility Goal: LTG Patient will propel w/c in controlled environment (PT) LTG: Patient will propel wheelchair in controlled environment, # of feet with assist (PT)  Outcome: Completed/Met Date Met: 11/14/16 150 ft  Goal: LTG Patient will propel w/c in home environment (PT) LTG: Patient will propel wheelchair in home environment, # of feet with assistance (PT).  Outcome: Completed/Met Date Met: 11/14/16 50 ft

## 2016-11-14 NOTE — Progress Notes (Signed)
Physical Therapy Session Note  Patient Details  Name: [name] FullingJanet White MRN: [id number] Date of Birth: 1946-06-09  Today's Date: 11/14/2016 PT Individual Time: 1105-1205 and [id number] PT Individual Time Calculation (min): 60 min and 47 min  Short Term Goals: Week 1:  PT Short Term Goal 1 (Week 1): = LTGs due to anticipated LOS  Skilled Therapeutic Interventions/Progress Updates:  Treatment 1: Pt received in w/c & agreeable to tx. Pt denied c/o pain at rest but notes spasm in LLE with activity & therapist assisted with repositioning LLE for comfort. Session focused on grad day tasks with pt ambulating over even surface with RW & Mod I x 45 ft. Educated & provided pt with demonstration to utilize UE's more during task, instead of hopping on RLE, with good return demo by pt. Pt performed transfers (sit<>stand and car) with RW & Mod I. Educated pt on positioning in car and to enter backseat through driver's side door for increased positioning of LLE on seat. Pt completed bed mobility with leg lifter and mod I with mat set at 24" high to simulate bed at home. Pt reports her son will assist her up/down ramp at house as it has a significant incline she cannot propel up/down herself. Pt able to recall all leg precautions independently during session. Pt denies concerns or questions regarding d/c home tomorrow; therapist reviewed d/c process with pt. Pt able to manage all w/c parts during session with cuing only once to lock brakes before transfer. Discussed DME needs & instructed pt on ability to purchase leg lifter, reacher, and sock aide in gift shop. At end of session pt left sitting in w/c in room with all needs within reach & set up with meal tray.   Treatment 2: Pt received in w/c & agreeable to tx, denying c/o pain at rest. Pt's daughter present for session. Pt ambulated 45 ft + 45 ft with RW & Mod I with cuing to utilize BUE to reduce hopping on RLE; pt with fair return demo and would benefit from further  instruction on gait technique while maintaining NWB LLE. Assisted pt in d/c planning with pt voicing no concerns regarding d/c and reporting her family has prepared her home to reduce tripping hazards. Pt performed bed mobility with bed set at 24 inches tall to simulate home environment. Pt able to transfer supine<>sitting with Mod I & use of leg lifter. At end of session pt left sitting in w/c in room with daughter present to supervise.     Therapy Documentation Precautions:  Precautions Precautions: Fall Required Braces or Orthoses: Other Brace/Splint Knee Immobilizer - Left: On when out of bed or walking Other Brace/Splint: hinged knee brace Restrictions Weight Bearing Restrictions: Yes LLE Weight Bearing: Non weight bearing Other Position/Activity Restrictions: Unrestricted ROM per ortho MD; Brace OK to be off when in bed or chair.    See Function Navigator for Current Functional Status.   Therapy/Group: Individual Therapy  Sandi MariscalVictoria M Miller 11/14/2016, 7:29 PM
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
## 2016-11-15 DIAGNOSIS — D72829 Elevated white blood cell count, unspecified: Secondary | ICD-10-CM

## 2016-11-15 LAB — URINALYSIS, ROUTINE W REFLEX MICROSCOPIC
BACTERIA UA: NONE SEEN
Glucose, UA: NEGATIVE mg/dL
Hgb urine dipstick: NEGATIVE
KETONES UR: NEGATIVE mg/dL
Nitrite: NEGATIVE
PROTEIN: NEGATIVE mg/dL
Specific Gravity, Urine: 1.031 — ABNORMAL HIGH (ref 1.005–1.030)
pH: 5 (ref 5.0–8.0)

## 2016-11-15 LAB — CREATININE, SERUM
CREATININE: 0.75 mg/dL (ref 0.44–1.00)
GFR calc Af Amer: >60 mL/min (ref >60)

## 2016-11-15 MED ORDER — RIVAROXABAN 20 MG PO TABS: ORAL_TABLET | ORAL | 1 refills | Status: AC

## 2016-11-15 MED ORDER — FAMOTIDINE 20 MG PO TABS: 20.0000 mg | ORAL_TABLET | Freq: Two times a day (BID) | ORAL | 1 refills | Status: AC

## 2016-11-15 MED ORDER — TRAMADOL HCL 50 MG PO TABS: 50.0000 mg | ORAL_TABLET | Freq: Four times a day (QID) | ORAL | 0 refills | Status: AC | PRN

## 2016-11-15 MED ORDER — RIVAROXABAN 15 MG PO TABS: 15.0000 mg | ORAL_TABLET | Freq: Two times a day (BID) | ORAL | 0 refills | Status: DC

## 2016-11-15 MED ORDER — RIVAROXABAN 20 MG PO TABS: ORAL_TABLET | ORAL | 1 refills | Status: DC

## 2016-11-15 MED ORDER — RIVAROXABAN 15 MG PO TABS: ORAL_TABLET | ORAL | 0 refills | Status: DC

## 2016-11-15 MED ORDER — METHOCARBAMOL 500 MG PO TABS: 500.0000 mg | ORAL_TABLET | Freq: Four times a day (QID) | ORAL | 0 refills | Status: AC | PRN

## 2016-11-15 MED ORDER — RIVAROXABAN 15 MG PO TABS: ORAL_TABLET | ORAL | 0 refills | Status: AC

## 2016-11-15 MED ORDER — RIVAROXABAN 20 MG PO TABS: 20.0000 mg | ORAL_TABLET | Freq: Every day | ORAL | 1 refills | Status: DC

## 2016-11-15 NOTE — Progress Notes (Signed)
Patient stated she was comfortable with discharge information. Sutures removed. R leg lower part of incision 1 suture did not completely come out. Made patient aware, she said thank you and did not express concern but would watch it. Pain managed. Patient belongings packed, escorted to car by NT, family.

## 2016-11-15 NOTE — Progress Notes (Signed)
Social Work  Discharge Note  The overall goal for the admission was met for:   Discharge location: Yes - home with family providing 24/7 assistance  Length of Stay: Yes - 7 days  Discharge activity level: Yes - mod independent  Home/community participation: Yes  Services provided included: MD, RD, PT, OT, RN, TR, Pharmacy and [place]: [facility]  Follow-up services arranged: Home Health: PT via [place], DME: all needed DME ordered on Acute via [place] and Patient/Family has no preference for HH/DME agencies  Comments (or additional information):  Patient/Family verbalized understanding of follow-up arrangements: Yes  Individual responsible for coordination of the follow-up plan: pt  Confirmed correct DME delivered: Ellerie Arenz 11/15/2016    Zoua Caporaso
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
## 2016-11-16 LAB — URINE CULTURE

## 2016-11-30 ENCOUNTER — Ambulatory Visit: Payer: Medicare Other | Attending: Orthopedic Surgery | Admitting: Physical Therapy

## 2016-11-30 DIAGNOSIS — R262 Difficulty in walking, not elsewhere classified: Secondary | ICD-10-CM | POA: Diagnosis present

## 2016-11-30 DIAGNOSIS — S72415S Nondisplaced unspecified condyle fracture of lower end of left femur, sequela: Secondary | ICD-10-CM | POA: Insufficient documentation

## 2016-11-30 DIAGNOSIS — M6281 Muscle weakness (generalized): Secondary | ICD-10-CM

## 2016-11-30 DIAGNOSIS — X58XXXS Exposure to other specified factors, sequela: Secondary | ICD-10-CM | POA: Insufficient documentation

## 2016-11-30 DIAGNOSIS — M79605 Pain in left leg: Secondary | ICD-10-CM | POA: Diagnosis present
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
## 2016-12-02 ENCOUNTER — Encounter: Payer: Medicare Other | Admitting: Physical Therapy

## 2016-12-02 DIAGNOSIS — D62 Acute posthemorrhagic anemia: Secondary | ICD-10-CM | POA: Diagnosis not present

## 2016-12-02 DIAGNOSIS — S72432D Displaced fracture of medial condyle of left femur, subsequent encounter for closed fracture with routine healing: Secondary | ICD-10-CM | POA: Diagnosis not present

## 2016-12-02 DIAGNOSIS — I10 Essential (primary) hypertension: Secondary | ICD-10-CM | POA: Diagnosis not present

## 2016-12-02 DIAGNOSIS — I82432 Acute embolism and thrombosis of left popliteal vein: Secondary | ICD-10-CM | POA: Diagnosis not present

## 2016-12-07 ENCOUNTER — Ambulatory Visit: Payer: Medicare Other | Admitting: Physical Therapy

## 2016-12-07 DIAGNOSIS — S72415S Nondisplaced unspecified condyle fracture of lower end of left femur, sequela: Secondary | ICD-10-CM

## 2016-12-07 DIAGNOSIS — R262 Difficulty in walking, not elsewhere classified: Secondary | ICD-10-CM

## 2016-12-07 DIAGNOSIS — M6281 Muscle weakness (generalized): Secondary | ICD-10-CM

## 2016-12-07 DIAGNOSIS — M79605 Pain in left leg: Secondary | ICD-10-CM

## 2016-12-07 NOTE — Patient Instructions (Signed)
E-stim to quad with LAQ x 7 minutes at 20mA 10:10 cycling   Supine ROM to 75 degrees   Seated ROM with overpressure and gentle mobs to 103-105 degrees   SLRs without brace x 12 without quad lag   Sidelying hip abduction with knee extension brace on x 10   Noticeable atrophy of L quadricep and calf musculature.

## 2016-12-09 ENCOUNTER — Ambulatory Visit: Payer: Medicare Other | Admitting: Physical Therapy

## 2016-12-09 DIAGNOSIS — M6281 Muscle weakness (generalized): Secondary | ICD-10-CM

## 2016-12-09 DIAGNOSIS — S72415S Nondisplaced unspecified condyle fracture of lower end of left femur, sequela: Secondary | ICD-10-CM

## 2016-12-09 DIAGNOSIS — M79605 Pain in left leg: Secondary | ICD-10-CM | POA: Diagnosis not present

## 2016-12-09 DIAGNOSIS — R262 Difficulty in walking, not elsewhere classified: Secondary | ICD-10-CM
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
## 2016-12-13 ENCOUNTER — Ambulatory Visit: Payer: Medicare Other | Admitting: Physical Therapy

## 2016-12-13 DIAGNOSIS — M6281 Muscle weakness (generalized): Secondary | ICD-10-CM

## 2016-12-13 DIAGNOSIS — M79605 Pain in left leg: Secondary | ICD-10-CM | POA: Diagnosis not present

## 2016-12-13 DIAGNOSIS — R262 Difficulty in walking, not elsewhere classified: Secondary | ICD-10-CM

## 2016-12-13 DIAGNOSIS — S72415S Nondisplaced unspecified condyle fracture of lower end of left femur, sequela: Secondary | ICD-10-CM

## 2016-12-14 ENCOUNTER — Ambulatory Visit: Payer: Medicare Other | Admitting: Physical Therapy

## 2016-12-14 DIAGNOSIS — M6281 Muscle weakness (generalized): Secondary | ICD-10-CM

## 2016-12-14 DIAGNOSIS — R262 Difficulty in walking, not elsewhere classified: Secondary | ICD-10-CM

## 2016-12-14 DIAGNOSIS — S72415S Nondisplaced unspecified condyle fracture of lower end of left femur, sequela: Secondary | ICD-10-CM

## 2016-12-14 DIAGNOSIS — M79605 Pain in left leg: Secondary | ICD-10-CM
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
## 2016-12-14 NOTE — Patient Instructions (Signed)
Gait initially began to buckle while attempting to perform side stepping, dc'd  Seated knee flexion ROM to 112 maximum, performed PNF as well (contract relax)   Gait at the end of the session

## 2016-12-15 ENCOUNTER — Encounter: Payer: No Typology Code available for payment source | Admitting: Physical Medicine & Rehabilitation
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
## 2016-12-16 ENCOUNTER — Ambulatory Visit: Payer: Medicare Other | Attending: Orthopedic Surgery | Admitting: Physical Therapy

## 2016-12-16 DIAGNOSIS — S72415S Nondisplaced unspecified condyle fracture of lower end of left femur, sequela: Secondary | ICD-10-CM | POA: Insufficient documentation

## 2016-12-16 DIAGNOSIS — M6281 Muscle weakness (generalized): Secondary | ICD-10-CM

## 2016-12-16 DIAGNOSIS — R262 Difficulty in walking, not elsewhere classified: Secondary | ICD-10-CM | POA: Insufficient documentation

## 2016-12-16 DIAGNOSIS — M79605 Pain in left leg: Secondary | ICD-10-CM

## 2016-12-16 DIAGNOSIS — X58XXXS Exposure to other specified factors, sequela: Secondary | ICD-10-CM | POA: Insufficient documentation
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
## 2016-12-16 NOTE — Patient Instructions (Signed)
Attempted TKEs  Gait observation   Leg Press with single leg, double leg   Long arc quad with E-stim

## 2016-12-21 ENCOUNTER — Ambulatory Visit: Payer: Medicare Other | Admitting: Physical Therapy

## 2016-12-21 DIAGNOSIS — S72415S Nondisplaced unspecified condyle fracture of lower end of left femur, sequela: Secondary | ICD-10-CM

## 2016-12-21 DIAGNOSIS — R262 Difficulty in walking, not elsewhere classified: Secondary | ICD-10-CM | POA: Diagnosis not present

## 2016-12-21 DIAGNOSIS — M6281 Muscle weakness (generalized): Secondary | ICD-10-CM

## 2016-12-21 DIAGNOSIS — M79605 Pain in left leg: Secondary | ICD-10-CM

## 2016-12-21 NOTE — Patient Instructions (Addendum)
Standing hip abductions x 12 with red band, x 12 with red and yellow t-band for 2 sets    Soft tissue mobilization   Supine hip flexor stretch  Quad stretch in sitting   Side stepping at TM with HHA (difficult/painful but tolerable) x 8 for 2 sets   Leg Press 45# x 10 repetitions
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
## 2016-12-23 ENCOUNTER — Ambulatory Visit: Payer: Medicare Other | Admitting: Physical Therapy

## 2016-12-23 DIAGNOSIS — M6281 Muscle weakness (generalized): Secondary | ICD-10-CM

## 2016-12-23 DIAGNOSIS — M79605 Pain in left leg: Secondary | ICD-10-CM

## 2016-12-23 DIAGNOSIS — S72415S Nondisplaced unspecified condyle fracture of lower end of left femur, sequela: Secondary | ICD-10-CM

## 2016-12-23 DIAGNOSIS — R262 Difficulty in walking, not elsewhere classified: Secondary | ICD-10-CM

## 2016-12-23 NOTE — Patient Instructions (Signed)
Leg Press   TRX sit to stands   Seated flexion ROM to 110
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
## 2016-12-28 ENCOUNTER — Ambulatory Visit: Payer: Medicare Other | Admitting: Physical Therapy

## 2016-12-30 ENCOUNTER — Ambulatory Visit: Payer: Medicare Other | Admitting: Physical Therapy

## 2016-12-30 DIAGNOSIS — R262 Difficulty in walking, not elsewhere classified: Secondary | ICD-10-CM

## 2016-12-30 DIAGNOSIS — S72415S Nondisplaced unspecified condyle fracture of lower end of left femur, sequela: Secondary | ICD-10-CM

## 2016-12-30 DIAGNOSIS — M79605 Pain in left leg: Secondary | ICD-10-CM

## 2016-12-30 DIAGNOSIS — M6281 Muscle weakness (generalized): Secondary | ICD-10-CM

## 2016-12-30 NOTE — Patient Instructions (Signed)
Gait training with quad cane   LAQ - with 4# x 15, with 5# x 15, with 7.5# x   Attempted seated knee extension machine (unable to complete secondary to pain)
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
## 2017-01-04 ENCOUNTER — Ambulatory Visit: Payer: Medicare Other | Admitting: Physical Therapy

## 2017-01-04 DIAGNOSIS — M6281 Muscle weakness (generalized): Secondary | ICD-10-CM

## 2017-01-04 DIAGNOSIS — S72415S Nondisplaced unspecified condyle fracture of lower end of left femur, sequela: Secondary | ICD-10-CM

## 2017-01-04 DIAGNOSIS — R262 Difficulty in walking, not elsewhere classified: Secondary | ICD-10-CM

## 2017-01-04 DIAGNOSIS — M79605 Pain in left leg: Secondary | ICD-10-CM
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
## 2017-01-04 NOTE — Patient Instructions (Addendum)
Gait training   TM - 1.120mph  Knee stretching on table -- to 107 degrees   Sit to stands with hands, progressed to 2 fingers   Step ups to 1 step with riser   Sit to stands with pillow underneath (used hands for eccentrics)

## 2017-01-06 ENCOUNTER — Ambulatory Visit: Payer: Medicare Other | Admitting: Physical Therapy

## 2017-01-06 DIAGNOSIS — M6281 Muscle weakness (generalized): Secondary | ICD-10-CM

## 2017-01-06 DIAGNOSIS — R262 Difficulty in walking, not elsewhere classified: Secondary | ICD-10-CM

## 2017-01-06 DIAGNOSIS — S72415S Nondisplaced unspecified condyle fracture of lower end of left femur, sequela: Secondary | ICD-10-CM

## 2017-01-06 DIAGNOSIS — M79605 Pain in left leg: Secondary | ICD-10-CM

## 2017-01-06 NOTE — Patient Instructions (Addendum)
NuStep 5 minutes (unbilled for warm up to increase knee flexion)  Stair stretch at 2nd step 7 reps for 10-15 seconds up to 96-98 degrees on second bout after manual stretching.   Squats at treadmill 10 x 2 sets -- well tolerated, stretching reported.   SL squat 8 x 2 sets onlevel 17 of TG (challenging but well tolerated)    Manual PROM knee flexion in sitting 2 x 1-2 minutes to tolerance   Manual PROM knee flexion in supine 2 x 1-2 minutes to tolerance.

## 2017-01-07 ENCOUNTER — Other Ambulatory Visit: Payer: Self-pay | Admitting: Endocrinology

## 2017-01-07 DIAGNOSIS — O2230 Deep phlebothrombosis in pregnancy, unspecified trimester: Secondary | ICD-10-CM

## 2017-01-10 ENCOUNTER — Encounter: Payer: Medicare Other | Admitting: Physical Therapy

## 2017-01-10 ENCOUNTER — Telehealth: Payer: Self-pay | Admitting: Physical Therapy
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
## 2017-01-10 NOTE — Telephone Encounter (Signed)
Primary therapist called listed number for patient as she was not present for listed appointment. No answer or answering machine at listed number. Will continue to contact patient for follow up appointments.   [name] GarnetPatrick Amie White PT, DPT, CSCS

## 2017-01-12 ENCOUNTER — Ambulatory Visit: Payer: Medicare Other | Admitting: Physical Therapy

## 2017-01-12 DIAGNOSIS — S72415S Nondisplaced unspecified condyle fracture of lower end of left femur, sequela: Secondary | ICD-10-CM

## 2017-01-12 DIAGNOSIS — M6281 Muscle weakness (generalized): Secondary | ICD-10-CM

## 2017-01-12 DIAGNOSIS — R262 Difficulty in walking, not elsewhere classified: Secondary | ICD-10-CM

## 2017-01-12 DIAGNOSIS — M79605 Pain in left leg: Secondary | ICD-10-CM

## 2017-01-13 ENCOUNTER — Ambulatory Visit: Payer: Medicare Other | Admitting: Physical Therapy

## 2017-01-13 DIAGNOSIS — R262 Difficulty in walking, not elsewhere classified: Secondary | ICD-10-CM | POA: Diagnosis not present

## 2017-01-13 DIAGNOSIS — M79605 Pain in left leg: Secondary | ICD-10-CM

## 2017-01-13 DIAGNOSIS — S72415S Nondisplaced unspecified condyle fracture of lower end of left femur, sequela: Secondary | ICD-10-CM

## 2017-01-13 DIAGNOSIS — M6281 Muscle weakness (generalized): Secondary | ICD-10-CM
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
## 2017-01-17 ENCOUNTER — Ambulatory Visit: Payer: Medicare Other | Attending: Orthopedic Surgery | Admitting: Physical Therapy

## 2017-01-17 DIAGNOSIS — R262 Difficulty in walking, not elsewhere classified: Secondary | ICD-10-CM | POA: Diagnosis present

## 2017-01-17 DIAGNOSIS — S72415S Nondisplaced unspecified condyle fracture of lower end of left femur, sequela: Secondary | ICD-10-CM | POA: Diagnosis present

## 2017-01-17 DIAGNOSIS — X58XXXS Exposure to other specified factors, sequela: Secondary | ICD-10-CM | POA: Insufficient documentation

## 2017-01-17 DIAGNOSIS — M79605 Pain in left leg: Secondary | ICD-10-CM | POA: Diagnosis not present

## 2017-01-17 DIAGNOSIS — M6281 Muscle weakness (generalized): Secondary | ICD-10-CM | POA: Diagnosis present

## 2017-01-17 DIAGNOSIS — I10 Essential (primary) hypertension: Secondary | ICD-10-CM | POA: Diagnosis not present

## 2017-01-17 DIAGNOSIS — Z86718 Personal history of other venous thrombosis and embolism: Secondary | ICD-10-CM | POA: Insufficient documentation
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
## 2017-01-17 NOTE — Patient Instructions (Signed)
2nd step lunge to 90-95 degrees   Hot pack first

## 2017-01-19 ENCOUNTER — Ambulatory Visit: Payer: Medicare Other | Admitting: Physical Therapy

## 2017-01-19 DIAGNOSIS — R262 Difficulty in walking, not elsewhere classified: Secondary | ICD-10-CM

## 2017-01-19 DIAGNOSIS — M6281 Muscle weakness (generalized): Secondary | ICD-10-CM

## 2017-01-19 DIAGNOSIS — M79605 Pain in left leg: Secondary | ICD-10-CM

## 2017-01-19 DIAGNOSIS — S72415S Nondisplaced unspecified condyle fracture of lower end of left femur, sequela: Secondary | ICD-10-CM

## 2017-01-19 NOTE — Patient Instructions (Addendum)
Sit to stands with 5# DBs in each hand x 10, progressed to deeper angle (shifting to the R), progressed to lowest portion of the black mat table.   Gait assessment -- notable for decreased L knee extension in terminal swing relative to RLE, improved weightbearing tolerance on LLE. Also noted decreased toe off on LLE relative to RLE -- cued to push through L big toe more to initiate swing phase, take larger steps with RLE.   Leg Press 55# x 8, 75#x8, 95# x10  2nd step knee flexion lunge to 103 degrees after brief warm up period provided.   Total gym 15 (working on hopping) level 19/20 -- x 6 for 4 sets noted to have decreased total ROM bilaterally L>R into extension from flexion initially. Provided video feedback and cuing which improved ROM she went through as well as decreased asymmetry in landing noted (R landing before LLE).
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
## 2017-01-25 ENCOUNTER — Ambulatory Visit: Payer: Medicare Other | Admitting: Physical Therapy

## 2017-01-25 DIAGNOSIS — M6281 Muscle weakness (generalized): Secondary | ICD-10-CM

## 2017-01-25 DIAGNOSIS — M79605 Pain in left leg: Secondary | ICD-10-CM

## 2017-01-25 DIAGNOSIS — R262 Difficulty in walking, not elsewhere classified: Secondary | ICD-10-CM

## 2017-01-25 DIAGNOSIS — S72415S Nondisplaced unspecified condyle fracture of lower end of left femur, sequela: Secondary | ICD-10-CM

## 2017-01-25 NOTE — Patient Instructions (Addendum)
104-105 degrees in 2nd step   107 degrees with over pressure in sitting   Leg Curls x10 repetitions with 15# for 2 sets   TRX squats (unable to complete sit to stands)   TRX single leg eccentrics   Total gym level 26

## 2017-01-27 ENCOUNTER — Encounter: Payer: Medicare Other | Admitting: Physical Therapy
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
## 2017-01-28 ENCOUNTER — Ambulatory Visit: Payer: Medicare Other | Admitting: Physical Therapy

## 2017-01-28 DIAGNOSIS — R262 Difficulty in walking, not elsewhere classified: Secondary | ICD-10-CM

## 2017-01-28 DIAGNOSIS — M6281 Muscle weakness (generalized): Secondary | ICD-10-CM

## 2017-01-28 DIAGNOSIS — M79605 Pain in left leg: Secondary | ICD-10-CM

## 2017-01-28 DIAGNOSIS — S72415S Nondisplaced unspecified condyle fracture of lower end of left femur, sequela: Secondary | ICD-10-CM

## 2017-01-28 NOTE — Patient Instructions (Addendum)
NuStep 6 minutes (unbilled)   2nd step lunge to 90-93 degrees of motion (reports feeling stiffness   Heat applied to L quadricep along with low load long duration stretching at the knee joint x 8 minutes   Measured up to 103 degrees   Step ups to 2 risers with bilateral HHA x 12 repetitions (with unilateral HHA x 5 repetitions) -- unilateral HHA x 12 repetitions on 2nd set   Leg Press 65# x 8 repetitions with bilateral for concentric (LLE on eccentric portion) x 3 sets (2nd set - 75#)

## 2017-01-31 ENCOUNTER — Ambulatory Visit: Payer: Medicare Other | Admitting: Physical Therapy

## 2017-01-31 DIAGNOSIS — M79605 Pain in left leg: Secondary | ICD-10-CM

## 2017-01-31 DIAGNOSIS — S72415S Nondisplaced unspecified condyle fracture of lower end of left femur, sequela: Secondary | ICD-10-CM

## 2017-01-31 DIAGNOSIS — R262 Difficulty in walking, not elsewhere classified: Secondary | ICD-10-CM | POA: Diagnosis not present

## 2017-01-31 DIAGNOSIS — M6281 Muscle weakness (generalized): Secondary | ICD-10-CM

## 2017-01-31 NOTE — Patient Instructions (Addendum)
Initially to 97-98 degrees of flexion in weightbearing   EDGE tool, long axis mobilizaitons   To 107-108 degrees in weightbearing  Gait observation (less Trendelenburg, continues to land in low   TKEs x 25 with red t-band, progressed to green t-band   Step ups to Blue side of BOSU ball x 12 repetitions for 2 sets   Step ups to 2 risers on plastic step x 12 repetitions   Mini squats to seat x 12 with 2 finger HHA x 2 sets   TRX squats x 15 repetitions through available ROM  Sit to stands without use of UEs aside from bracing at thighs x 5 repetitions.
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
## 2017-02-03 ENCOUNTER — Ambulatory Visit: Payer: Medicare Other | Admitting: Physical Therapy

## 2017-02-03 DIAGNOSIS — M79605 Pain in left leg: Secondary | ICD-10-CM

## 2017-02-03 DIAGNOSIS — R262 Difficulty in walking, not elsewhere classified: Secondary | ICD-10-CM | POA: Diagnosis not present

## 2017-02-03 DIAGNOSIS — M6281 Muscle weakness (generalized): Secondary | ICD-10-CM

## 2017-02-03 DIAGNOSIS — S72415S Nondisplaced unspecified condyle fracture of lower end of left femur, sequela: Secondary | ICD-10-CM

## 2017-02-03 NOTE — Patient Instructions (Addendum)
Edge tool  Flexion to 107-108 on stairs   Biketo level 11   Total gym level 16 jumping x8 x 10 repetitions x 10 repetitions   Total gym level 26 squats with cuing to move as fast as possible  3 sets x 10 repetitions   Leg press 3x6 at 65#, 85#, 105#, 115#  TRX eccentric single leg sit to stands (concentric as well) required heel of RLE to provide additional balance point, unable to complete without RLE providing support x 12 for 2 sets. -- fatigued afterwards.   Low load long duration stretching stretching   2nd step lunge step to 110 degrees
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
## 2017-02-07 ENCOUNTER — Ambulatory Visit: Payer: Medicare Other | Admitting: Physical Therapy

## 2017-02-07 DIAGNOSIS — M79605 Pain in left leg: Secondary | ICD-10-CM

## 2017-02-07 DIAGNOSIS — M6281 Muscle weakness (generalized): Secondary | ICD-10-CM

## 2017-02-07 DIAGNOSIS — S72415S Nondisplaced unspecified condyle fracture of lower end of left femur, sequela: Secondary | ICD-10-CM

## 2017-02-07 DIAGNOSIS — R262 Difficulty in walking, not elsewhere classified: Secondary | ICD-10-CM | POA: Diagnosis not present

## 2017-02-07 NOTE — Patient Instructions (Addendum)
Initial weightbearing ROM to 98 degrees   Total gym 3 sets x 8 repetitions with two legs for concentric (single leg eccentric)   Step ups x15 with 3#, 5#, x 12 with 8# with LLE only and RUE only   Edge tool   Weightbearing to 111 degrees
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
## 2017-02-10 ENCOUNTER — Ambulatory Visit: Payer: Medicare Other | Admitting: Physical Therapy

## 2017-02-10 DIAGNOSIS — R262 Difficulty in walking, not elsewhere classified: Secondary | ICD-10-CM

## 2017-02-10 DIAGNOSIS — S72415S Nondisplaced unspecified condyle fracture of lower end of left femur, sequela: Secondary | ICD-10-CM

## 2017-02-10 DIAGNOSIS — M79605 Pain in left leg: Secondary | ICD-10-CM

## 2017-02-10 DIAGNOSIS — M6281 Muscle weakness (generalized): Secondary | ICD-10-CM
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
## 2017-02-10 NOTE — Patient Instructions (Signed)
TKEs - with blue and green t-band x 10 for 2 sets   Leg press 65# x 9, x8, x8 (for speed)   TRX Squats x12 repetitions through full available range  Squats to chair (unable to get all the way down x 10) x 12    Deadlifts with 20# KB x 10 repetitions (appropriate sensation of posterior hip strengthening) x 2 sets with 40# KB x 5 repetitions   Step Ups x6 with 10# x 8 repetitions with 1 HHA   Total gym hop from R to L on level 12 x 10 repetitions per side for 2 sets

## 2017-02-14 ENCOUNTER — Ambulatory Visit
Admission: RE | Admit: 2017-02-14 | Discharge: 2017-02-14 | Disposition: A | Discharge status: Home / Self Care | Payer: Medicare Other | Source: Ambulatory Visit | Attending: Endocrinology | Admitting: Endocrinology

## 2017-02-14 ENCOUNTER — Other Ambulatory Visit: Payer: Self-pay | Admitting: Endocrinology

## 2017-02-14 DIAGNOSIS — O2230 Deep phlebothrombosis in pregnancy, unspecified trimester: Secondary | ICD-10-CM

## 2017-02-15 ENCOUNTER — Ambulatory Visit: Payer: Medicare Other | Admitting: Physical Therapy

## 2017-02-17 ENCOUNTER — Ambulatory Visit: Payer: Medicare Other | Attending: Orthopedic Surgery | Admitting: Physical Therapy

## 2017-02-17 DIAGNOSIS — R262 Difficulty in walking, not elsewhere classified: Secondary | ICD-10-CM | POA: Diagnosis not present

## 2017-02-17 DIAGNOSIS — M6281 Muscle weakness (generalized): Secondary | ICD-10-CM | POA: Insufficient documentation

## 2017-02-17 DIAGNOSIS — M79605 Pain in left leg: Secondary | ICD-10-CM | POA: Diagnosis not present

## 2017-02-17 DIAGNOSIS — I10 Essential (primary) hypertension: Secondary | ICD-10-CM | POA: Insufficient documentation

## 2017-02-17 DIAGNOSIS — S72415S Nondisplaced unspecified condyle fracture of lower end of left femur, sequela: Secondary | ICD-10-CM | POA: Insufficient documentation

## 2017-02-17 DIAGNOSIS — D72829 Elevated white blood cell count, unspecified: Secondary | ICD-10-CM | POA: Insufficient documentation

## 2017-02-17 DIAGNOSIS — X58XXXS Exposure to other specified factors, sequela: Secondary | ICD-10-CM | POA: Diagnosis not present

## 2017-02-17 DIAGNOSIS — Z86718 Personal history of other venous thrombosis and embolism: Secondary | ICD-10-CM | POA: Insufficient documentation
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
## 2017-02-22 ENCOUNTER — Ambulatory Visit: Payer: Medicare Other | Admitting: Physical Therapy

## 2017-02-25 ENCOUNTER — Ambulatory Visit: Payer: Medicare Other | Admitting: Physical Therapy

## 2017-02-25 DIAGNOSIS — R262 Difficulty in walking, not elsewhere classified: Secondary | ICD-10-CM | POA: Diagnosis not present

## 2017-02-25 DIAGNOSIS — M6281 Muscle weakness (generalized): Secondary | ICD-10-CM

## 2017-02-25 DIAGNOSIS — M79605 Pain in left leg: Secondary | ICD-10-CM

## 2017-02-25 DIAGNOSIS — S72415S Nondisplaced unspecified condyle fracture of lower end of left femur, sequela: Secondary | ICD-10-CM

## 2017-02-25 NOTE — Patient Instructions (Addendum)
Step ups (go up to 4# x 15 for 3 sets with 1 HHA   Sit to stands with no weight x 10, 1# DBs x 8, 2# x 10  Full depth TRX squats x 8 repetitions -- 2 sets   Hopping in place on blue foam pad 3 sets  X 8 reps with HHA   2nd step lunge stretch to 113 degrees

## 2017-03-01 ENCOUNTER — Ambulatory Visit: Payer: Medicare Other | Admitting: Physical Therapy

## 2017-03-04 ENCOUNTER — Ambulatory Visit: Payer: Medicare Other | Admitting: Physical Therapy

## 2017-03-04 DIAGNOSIS — M6281 Muscle weakness (generalized): Secondary | ICD-10-CM

## 2017-03-04 DIAGNOSIS — S72415S Nondisplaced unspecified condyle fracture of lower end of left femur, sequela: Secondary | ICD-10-CM

## 2017-03-04 DIAGNOSIS — M79605 Pain in left leg: Secondary | ICD-10-CM

## 2017-03-04 DIAGNOSIS — R262 Difficulty in walking, not elsewhere classified: Secondary | ICD-10-CM | POA: Diagnosis not present

## 2017-03-04 NOTE — Patient Instructions (Addendum)
Gait analysis   Stair analysis   Leg Press 95# x 10, 115# x6, 135# x8   LAQ x15# for 2 sets of 8 repetitions   SLDL x 8 repetitions with one hand hold assistance  Squat x 8 without HHA x 2 sets
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
## 2017-03-08 ENCOUNTER — Ambulatory Visit: Payer: Medicare Other | Admitting: Physical Therapy

## 2017-03-08 DIAGNOSIS — R262 Difficulty in walking, not elsewhere classified: Secondary | ICD-10-CM

## 2017-03-08 DIAGNOSIS — M79605 Pain in left leg: Secondary | ICD-10-CM

## 2017-03-08 DIAGNOSIS — S72415S Nondisplaced unspecified condyle fracture of lower end of left femur, sequela: Secondary | ICD-10-CM

## 2017-03-08 DIAGNOSIS — M6281 Muscle weakness (generalized): Secondary | ICD-10-CM
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
## 2017-03-08 NOTE — Patient Instructions (Signed)
SLDL with 5# DB  Deadlifts with 10-20#  Standing hip abductions   Side stepping
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
## 2017-03-11 ENCOUNTER — Encounter: Payer: Medicare Other | Admitting: Physical Therapy

## 2017-03-15 ENCOUNTER — Ambulatory Visit: Payer: Medicare Other | Admitting: Physical Therapy

## 2017-03-15 DIAGNOSIS — R262 Difficulty in walking, not elsewhere classified: Secondary | ICD-10-CM

## 2017-03-15 DIAGNOSIS — M79605 Pain in left leg: Secondary | ICD-10-CM

## 2017-03-15 DIAGNOSIS — M6281 Muscle weakness (generalized): Secondary | ICD-10-CM

## 2017-03-15 DIAGNOSIS — S72415S Nondisplaced unspecified condyle fracture of lower end of left femur, sequela: Secondary | ICD-10-CM

## 2017-03-15 NOTE — Patient Instructions (Addendum)
Leg Press x 75# x 12, 135#x8, x 6 ( control on eccentric)   Sit to stands from black mat table x 15 with 5# DBs, with 10# DBs x 10 repetitions (became fatigue) x   Deadlifts with 20# x 8 repetitions for 2 sets, 40# x 6 (no pain reported)  Leg Extensions x 8 with 25# of resistance for bilateral LEs    BOSU squats with blue side down x 10 with HHA
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
## 2017-03-17 ENCOUNTER — Ambulatory Visit: Payer: Medicare Other | Admitting: Physical Therapy

## 2017-03-17 DIAGNOSIS — M79605 Pain in left leg: Secondary | ICD-10-CM

## 2017-03-17 DIAGNOSIS — M6281 Muscle weakness (generalized): Secondary | ICD-10-CM

## 2017-03-17 DIAGNOSIS — R262 Difficulty in walking, not elsewhere classified: Secondary | ICD-10-CM

## 2017-03-17 DIAGNOSIS — S72415S Nondisplaced unspecified condyle fracture of lower end of left femur, sequela: Secondary | ICD-10-CM
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
## 2017-03-22 ENCOUNTER — Ambulatory Visit: Payer: Medicare Other | Attending: Orthopedic Surgery | Admitting: Physical Therapy

## 2017-03-22 DIAGNOSIS — M79605 Pain in left leg: Secondary | ICD-10-CM | POA: Diagnosis not present

## 2017-03-22 DIAGNOSIS — X58XXXS Exposure to other specified factors, sequela: Secondary | ICD-10-CM | POA: Insufficient documentation

## 2017-03-22 DIAGNOSIS — M6281 Muscle weakness (generalized): Secondary | ICD-10-CM | POA: Insufficient documentation

## 2017-03-22 DIAGNOSIS — S72415S Nondisplaced unspecified condyle fracture of lower end of left femur, sequela: Secondary | ICD-10-CM | POA: Diagnosis present

## 2017-03-22 DIAGNOSIS — R262 Difficulty in walking, not elsewhere classified: Secondary | ICD-10-CM | POA: Diagnosis not present
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
## 2017-03-22 NOTE — Patient Instructions (Signed)
Attempted jumping on blue foam, too uncomfortable   Landings on BOSU on LLE   Standing hip abduction machine   Monster walk   Side stepping with blue t-band   TRX Squats --

## 2017-03-25 ENCOUNTER — Encounter: Payer: Self-pay | Admitting: Physical Therapy

## 2017-03-25 ENCOUNTER — Ambulatory Visit: Payer: Medicare Other | Admitting: Physical Therapy

## 2017-03-25 DIAGNOSIS — R262 Difficulty in walking, not elsewhere classified: Secondary | ICD-10-CM | POA: Diagnosis not present

## 2017-03-25 DIAGNOSIS — S72415S Nondisplaced unspecified condyle fracture of lower end of left femur, sequela: Secondary | ICD-10-CM

## 2017-03-25 DIAGNOSIS — M79605 Pain in left leg: Secondary | ICD-10-CM

## 2017-03-25 DIAGNOSIS — M6281 Muscle weakness (generalized): Secondary | ICD-10-CM
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
## 2017-03-29 ENCOUNTER — Ambulatory Visit: Payer: Medicare Other | Admitting: Physical Therapy

## 2017-04-01 ENCOUNTER — Ambulatory Visit: Payer: Medicare Other | Admitting: Physical Therapy

## 2017-04-01 DIAGNOSIS — M6281 Muscle weakness (generalized): Secondary | ICD-10-CM

## 2017-04-01 DIAGNOSIS — R262 Difficulty in walking, not elsewhere classified: Secondary | ICD-10-CM | POA: Diagnosis not present

## 2017-04-01 DIAGNOSIS — S72415S Nondisplaced unspecified condyle fracture of lower end of left femur, sequela: Secondary | ICD-10-CM

## 2017-04-01 DIAGNOSIS — M79605 Pain in left leg: Secondary | ICD-10-CM
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
## 2017-04-06 ENCOUNTER — Ambulatory Visit: Payer: Medicare Other | Admitting: Physical Therapy

## 2017-04-08 ENCOUNTER — Ambulatory Visit: Payer: Medicare Other | Admitting: Physical Therapy

## 2017-04-12 ENCOUNTER — Encounter: Payer: Medicare Other | Admitting: Physical Therapy

## 2017-04-14 ENCOUNTER — Encounter: Payer: Medicare Other | Admitting: Physical Therapy

## 2017-04-19 ENCOUNTER — Encounter: Payer: Medicare Other | Admitting: Physical Therapy

## 2017-04-22 ENCOUNTER — Encounter: Payer: Medicare Other | Admitting: Physical Therapy

## 2017-04-26 ENCOUNTER — Encounter: Payer: Medicare Other | Admitting: Physical Therapy

## 2017-04-28 ENCOUNTER — Encounter: Payer: Medicare Other | Admitting: Physical Therapy

## 2017-06-27 ENCOUNTER — Other Ambulatory Visit: Payer: Self-pay | Admitting: Internal Medicine

## 2017-06-27 DIAGNOSIS — R1903 Right lower quadrant abdominal swelling, mass and lump: Secondary | ICD-10-CM

## 2017-06-27 DIAGNOSIS — E010 Iodine-deficiency related diffuse (endemic) goiter: Secondary | ICD-10-CM

## 2017-07-07 ENCOUNTER — Ambulatory Visit
Admission: RE | Admit: 2017-07-07 | Discharge: 2017-07-07 | Disposition: A | Discharge status: Home / Self Care | Payer: Medicare Other | Source: Ambulatory Visit | Attending: Internal Medicine | Admitting: Internal Medicine

## 2017-07-07 DIAGNOSIS — R1903 Right lower quadrant abdominal swelling, mass and lump: Secondary | ICD-10-CM

## 2017-07-07 DIAGNOSIS — E010 Iodine-deficiency related diffuse (endemic) goiter: Secondary | ICD-10-CM

## 2017-08-03 ENCOUNTER — Ambulatory Visit: Payer: Medicare Other | Admitting: Physical Therapy

## 2017-08-04 ENCOUNTER — Other Ambulatory Visit: Payer: Self-pay | Admitting: General Surgery

## 2017-08-04 DIAGNOSIS — R1903 Right lower quadrant abdominal swelling, mass and lump: Secondary | ICD-10-CM

## 2017-08-09 ENCOUNTER — Other Ambulatory Visit: Payer: Medicare Other

## 2017-08-16 ENCOUNTER — Ambulatory Visit: Payer: Medicare Other | Attending: Internal Medicine | Admitting: Physical Therapy

## 2017-08-16 DIAGNOSIS — M6281 Muscle weakness (generalized): Secondary | ICD-10-CM | POA: Insufficient documentation

## 2017-08-16 DIAGNOSIS — M79605 Pain in left leg: Secondary | ICD-10-CM | POA: Diagnosis present

## 2017-08-16 DIAGNOSIS — R262 Difficulty in walking, not elsewhere classified: Secondary | ICD-10-CM | POA: Diagnosis present

## 2017-08-16 NOTE — Patient Instructions (Signed)
Slump test - negative bilaterally   SLR negative bilaterally   Pain in the knee with hip IR, hip flexion (due to the knee flexion) IR/ER of the hip are WNL for ROM.   R hip IR/ER WNL and HS WNL, no pain   Treatment Supine isometric clamshells x 8 repetitions for 5-10"
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
## 2017-08-19 ENCOUNTER — Ambulatory Visit: Payer: Medicare Other | Admitting: Physical Therapy

## 2017-08-22 ENCOUNTER — Ambulatory Visit: Payer: Medicare Other | Attending: Internal Medicine | Admitting: Physical Therapy

## 2017-08-22 DIAGNOSIS — R262 Difficulty in walking, not elsewhere classified: Secondary | ICD-10-CM | POA: Diagnosis present

## 2017-08-22 DIAGNOSIS — X58XXXS Exposure to other specified factors, sequela: Secondary | ICD-10-CM | POA: Insufficient documentation

## 2017-08-22 DIAGNOSIS — S72415S Nondisplaced unspecified condyle fracture of lower end of left femur, sequela: Secondary | ICD-10-CM | POA: Insufficient documentation

## 2017-08-22 DIAGNOSIS — M79605 Pain in left leg: Secondary | ICD-10-CM | POA: Diagnosis present

## 2017-08-22 DIAGNOSIS — M6281 Muscle weakness (generalized): Secondary | ICD-10-CM | POA: Insufficient documentation

## 2017-08-22 NOTE — Patient Instructions (Addendum)
Sidelying clamshells with yellow t-band with LLE on top x 15 repetitions for 3 sets   Sidelying hip abductions with yellow t-band around knees x 15 repetitions for 3 sets   Supine bridging with cuing for keeping her toes up and pushing through her heels x 15 for 3 sets   Supine isometric clamshells x 8 repetitions for 15" holds
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
## 2017-08-26 ENCOUNTER — Ambulatory Visit: Payer: Medicare Other | Admitting: Physical Therapy

## 2017-08-26 DIAGNOSIS — M6281 Muscle weakness (generalized): Secondary | ICD-10-CM

## 2017-08-26 DIAGNOSIS — R262 Difficulty in walking, not elsewhere classified: Secondary | ICD-10-CM

## 2017-08-26 DIAGNOSIS — M79605 Pain in left leg: Secondary | ICD-10-CM
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
## 2017-08-29 ENCOUNTER — Ambulatory Visit: Payer: Medicare Other | Admitting: Physical Therapy

## 2017-08-31 ENCOUNTER — Ambulatory Visit: Payer: Medicare Other | Admitting: Physical Therapy

## 2017-09-02 ENCOUNTER — Ambulatory Visit: Payer: Medicare Other | Admitting: Physical Therapy

## 2017-09-05 ENCOUNTER — Ambulatory Visit: Payer: Medicare Other | Admitting: Physical Therapy

## 2017-09-05 DIAGNOSIS — S72415S Nondisplaced unspecified condyle fracture of lower end of left femur, sequela: Secondary | ICD-10-CM

## 2017-09-05 DIAGNOSIS — M6281 Muscle weakness (generalized): Secondary | ICD-10-CM | POA: Diagnosis not present

## 2017-09-05 DIAGNOSIS — M79605 Pain in left leg: Secondary | ICD-10-CM

## 2017-09-05 DIAGNOSIS — R262 Difficulty in walking, not elsewhere classified: Secondary | ICD-10-CM
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
## 2017-09-05 NOTE — Patient Instructions (Addendum)
High volt stimulation to medial quadriceps and lateral quadriceps at 130V continuous with moist hot pack applied x 10 minutes to increase quadriceps flexibility

## 2017-09-07 ENCOUNTER — Encounter: Payer: Medicare Other | Admitting: Physical Therapy

## 2017-09-07 ENCOUNTER — Ambulatory Visit: Payer: Medicare Other | Admitting: Physical Therapy

## 2017-09-07 DIAGNOSIS — M79605 Pain in left leg: Secondary | ICD-10-CM

## 2017-09-07 DIAGNOSIS — R262 Difficulty in walking, not elsewhere classified: Secondary | ICD-10-CM

## 2017-09-07 DIAGNOSIS — M6281 Muscle weakness (generalized): Secondary | ICD-10-CM | POA: Diagnosis not present
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
## 2017-09-12 ENCOUNTER — Ambulatory Visit: Payer: Medicare Other | Admitting: Physical Therapy

## 2017-09-12 DIAGNOSIS — M79605 Pain in left leg: Secondary | ICD-10-CM

## 2017-09-12 DIAGNOSIS — S72415S Nondisplaced unspecified condyle fracture of lower end of left femur, sequela: Secondary | ICD-10-CM

## 2017-09-12 DIAGNOSIS — M6281 Muscle weakness (generalized): Secondary | ICD-10-CM

## 2017-09-12 DIAGNOSIS — R262 Difficulty in walking, not elsewhere classified: Secondary | ICD-10-CM

## 2017-09-12 NOTE — Patient Instructions (Signed)
Leg Press 85# x10, 105# x 10 for 3 sets   Standing hip abductions with 40# x 10 bilaterally, 55#   Total gym single leg squats x12 at level 19, 2 sets at 16  Prone quad stretching with hot pack   Steps with 5# DB, 10# DB

## 2017-09-13 ENCOUNTER — Ambulatory Visit: Payer: Medicare Other | Admitting: Physical Therapy
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
## 2017-09-15 ENCOUNTER — Ambulatory Visit: Payer: Medicare Other | Admitting: Physical Therapy

## 2017-09-15 DIAGNOSIS — S72415S Nondisplaced unspecified condyle fracture of lower end of left femur, sequela: Secondary | ICD-10-CM

## 2017-09-15 DIAGNOSIS — M79605 Pain in left leg: Secondary | ICD-10-CM

## 2017-09-15 DIAGNOSIS — M6281 Muscle weakness (generalized): Secondary | ICD-10-CM

## 2017-09-15 DIAGNOSIS — R262 Difficulty in walking, not elsewhere classified: Secondary | ICD-10-CM

## 2017-09-15 NOTE — Patient Instructions (Signed)
Prone quad stretching x 6 minutes with moist hot pack applied to L quadricep  TRX box squats x12, box squats with 7# weight for 8 repetitions for 2 sets   Lunges onto blue side up of BOSU x 10 with HHA on both sides x 3 sets   Level 19 single leg squats x 10 for 3 sets on LLE (last set on level 21)
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
## 2017-09-19 ENCOUNTER — Ambulatory Visit: Payer: Medicare Other | Attending: Internal Medicine | Admitting: Physical Therapy

## 2017-09-19 DIAGNOSIS — X58XXXS Exposure to other specified factors, sequela: Secondary | ICD-10-CM | POA: Insufficient documentation

## 2017-09-19 DIAGNOSIS — R262 Difficulty in walking, not elsewhere classified: Secondary | ICD-10-CM | POA: Insufficient documentation

## 2017-09-19 DIAGNOSIS — M79605 Pain in left leg: Secondary | ICD-10-CM | POA: Diagnosis present

## 2017-09-19 DIAGNOSIS — S72415S Nondisplaced unspecified condyle fracture of lower end of left femur, sequela: Secondary | ICD-10-CM | POA: Diagnosis not present

## 2017-09-19 DIAGNOSIS — M6281 Muscle weakness (generalized): Secondary | ICD-10-CM | POA: Diagnosis not present
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
## 2017-09-19 NOTE — Patient Instructions (Addendum)
Prone ely's quad stretching on LLE with moist hot pack   Leg press with LLE only 55# x 15, 65# x 12, 8, 75# x 10, 75# x 10  Leg Extension 4 sets x 10 repetitions with 10#   Standing hip abductions with 40# 3 sets x 10 repetitions   Sit to stands with 5# x 12, 7# x 12, 10# x 10  Lunges

## 2017-09-22 ENCOUNTER — Ambulatory Visit: Payer: Medicare Other | Admitting: Physical Therapy

## 2017-09-22 DIAGNOSIS — M6281 Muscle weakness (generalized): Secondary | ICD-10-CM | POA: Diagnosis not present

## 2017-09-22 DIAGNOSIS — M79605 Pain in left leg: Secondary | ICD-10-CM

## 2017-09-22 DIAGNOSIS — R262 Difficulty in walking, not elsewhere classified: Secondary | ICD-10-CM

## 2017-09-22 NOTE — Patient Instructions (Signed)
Leg Press 45# x 10 repetitions for 1 leg for 3 sets   Step ups with BW x 12, 5# x12, 8# (too heavy), 5# x12  Ely's quad stretch with high volt stim applied to VL to VM at 160V with moist hot pack x 10 minutes   BOSU step ups x 12 with blue side up   Single leg squats on total gym level 21 x 12 repetitions for 3 sets
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
## 2017-09-26 ENCOUNTER — Ambulatory Visit: Payer: Medicare Other | Admitting: Physical Therapy

## 2017-09-29 ENCOUNTER — Ambulatory Visit: Payer: Medicare Other | Admitting: Physical Therapy

## 2017-10-05 ENCOUNTER — Other Ambulatory Visit: Payer: Self-pay

## 2017-10-05 ENCOUNTER — Encounter: Payer: Self-pay | Admitting: Physical Therapy

## 2017-10-05 ENCOUNTER — Ambulatory Visit: Payer: Medicare Other | Admitting: Physical Therapy

## 2017-10-05 DIAGNOSIS — M6281 Muscle weakness (generalized): Secondary | ICD-10-CM

## 2017-10-05 DIAGNOSIS — R262 Difficulty in walking, not elsewhere classified: Secondary | ICD-10-CM

## 2017-10-05 DIAGNOSIS — S72415S Nondisplaced unspecified condyle fracture of lower end of left femur, sequela: Secondary | ICD-10-CM

## 2017-10-05 DIAGNOSIS — M79605 Pain in left leg: Secondary | ICD-10-CM
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
## 2017-10-12 ENCOUNTER — Ambulatory Visit: Payer: Medicare Other | Admitting: Physical Therapy

## 2017-10-13 ENCOUNTER — Ambulatory Visit: Payer: Medicare Other | Admitting: Physical Therapy

## 2017-10-13 DIAGNOSIS — R262 Difficulty in walking, not elsewhere classified: Secondary | ICD-10-CM

## 2017-10-13 DIAGNOSIS — M79605 Pain in left leg: Secondary | ICD-10-CM

## 2017-10-13 DIAGNOSIS — M6281 Muscle weakness (generalized): Secondary | ICD-10-CM

## 2017-10-13 NOTE — Patient Instructions (Signed)
Prone quad stretching in Ely's position  Leg Press 55# x 12, 65# x10 for 2 sets
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
## 2017-10-19 ENCOUNTER — Ambulatory Visit: Payer: Medicare Other | Attending: Internal Medicine | Admitting: Physical Therapy

## 2017-10-19 DIAGNOSIS — S72415S Nondisplaced unspecified condyle fracture of lower end of left femur, sequela: Secondary | ICD-10-CM

## 2017-10-19 DIAGNOSIS — M6281 Muscle weakness (generalized): Secondary | ICD-10-CM

## 2017-10-19 DIAGNOSIS — R262 Difficulty in walking, not elsewhere classified: Secondary | ICD-10-CM

## 2017-10-19 DIAGNOSIS — M79605 Pain in left leg: Secondary | ICD-10-CM
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
## 2017-10-24 ENCOUNTER — Ambulatory Visit: Payer: Medicare Other

## 2017-10-26 ENCOUNTER — Ambulatory Visit: Payer: Medicare Other
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
## 2018-06-27 ENCOUNTER — Other Ambulatory Visit: Payer: Self-pay | Admitting: Internal Medicine

## 2018-06-27 DIAGNOSIS — E041 Nontoxic single thyroid nodule: Secondary | ICD-10-CM

## 2018-07-17 ENCOUNTER — Ambulatory Visit
Admission: RE | Admit: 2018-07-17 | Discharge: 2018-07-17 | Disposition: A | Discharge status: Home / Self Care | Payer: Medicare Other | Source: Ambulatory Visit | Attending: Internal Medicine | Admitting: Internal Medicine

## 2018-07-17 DIAGNOSIS — E041 Nontoxic single thyroid nodule: Secondary | ICD-10-CM

## 2018-08-26 IMAGING — US US EXTREM LOW VENOUS BILAT
1 series · 13 of 24 positions shown · non-contrast
Comparison: None.

CLINICAL DATA: Prior DVT, color changes in the left calf, right
calf pain.



[Series 1: us extrem low venous bilat · 0.07mm/px · 13 of 59 slices shown]
[im 1/59]
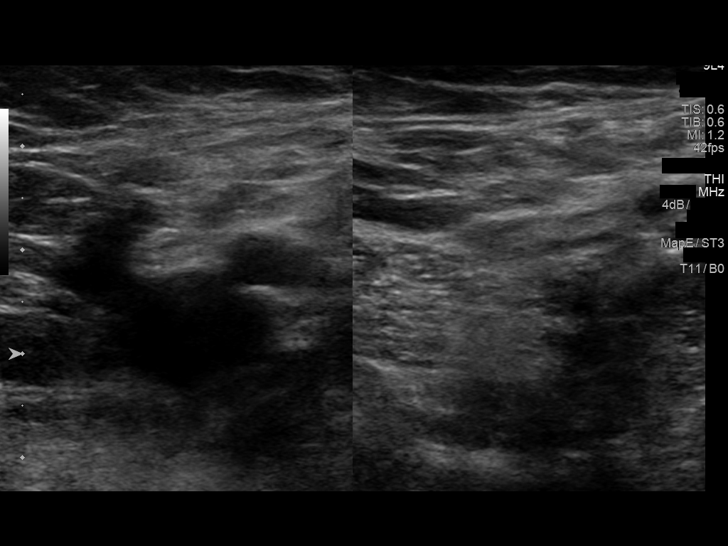
[im 6/59]
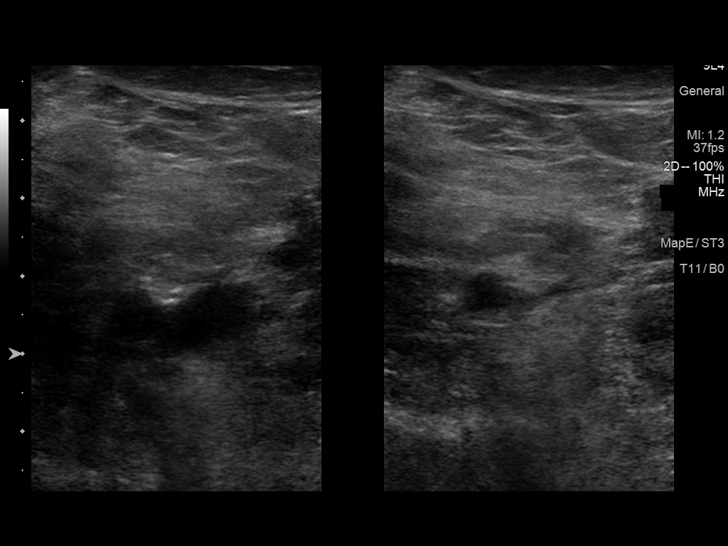
[im 11/59]
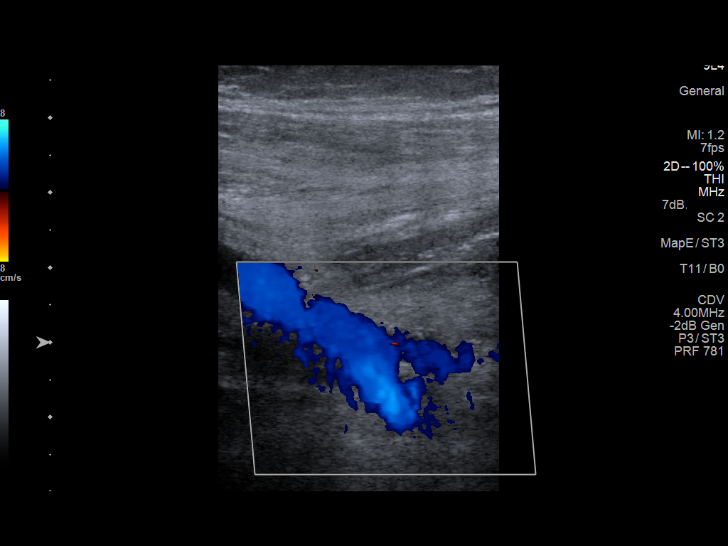
[im 16/59]
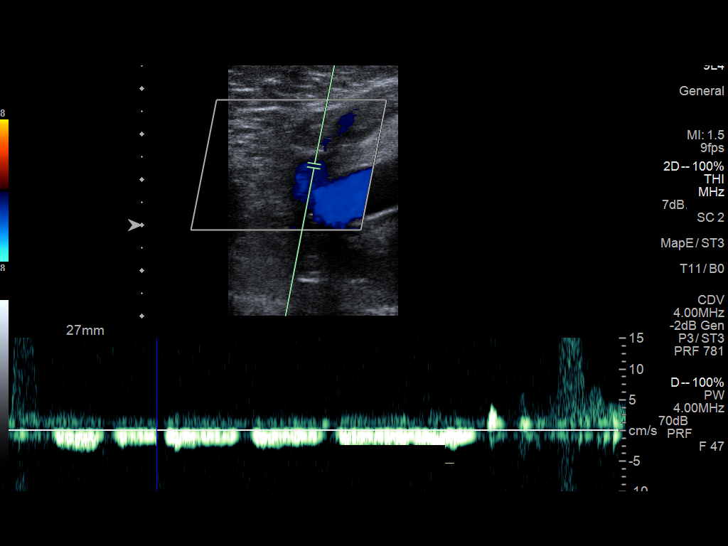
[im 21/59]
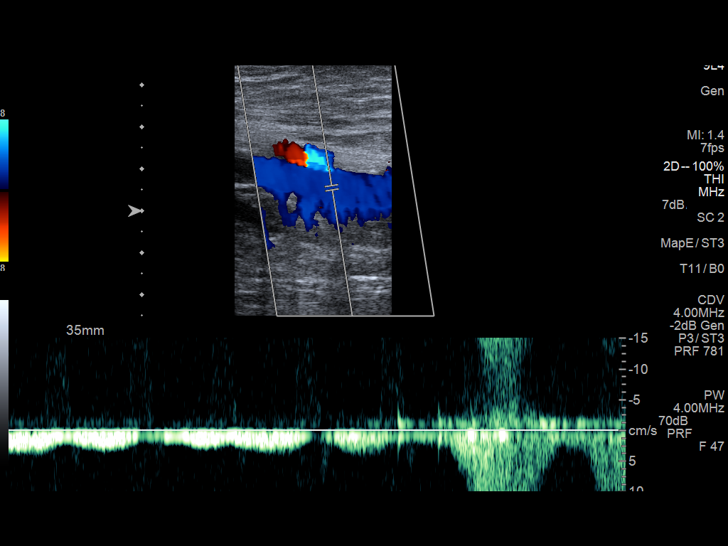
[im 26/59]
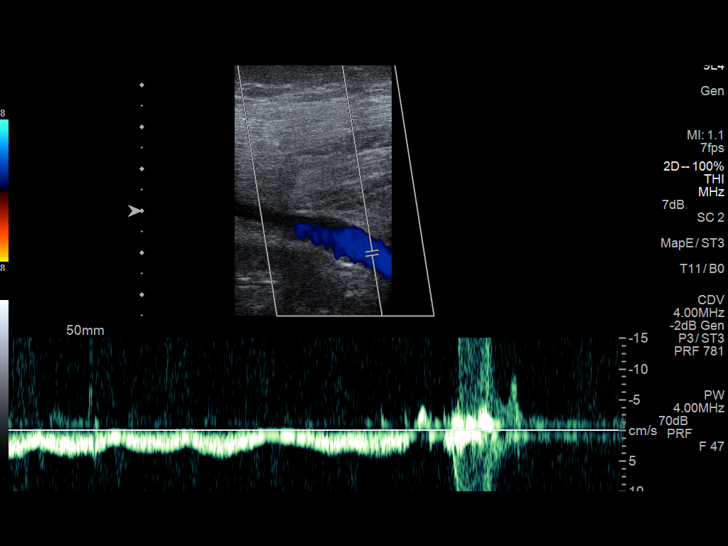
[im 31/59]
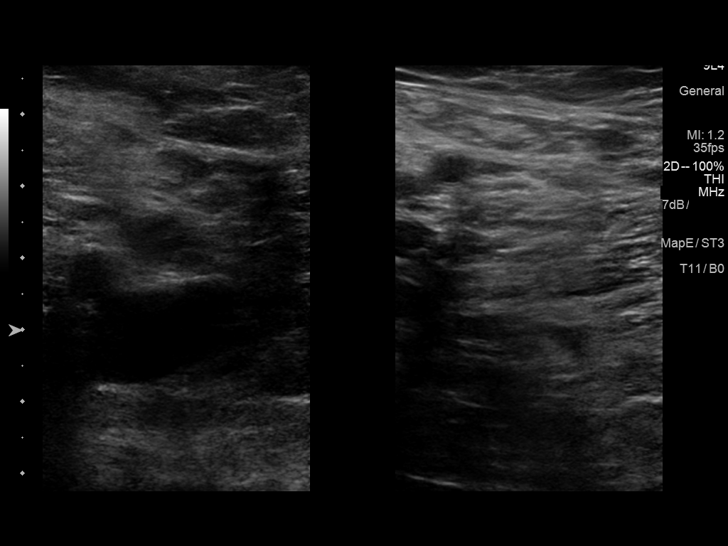
[im 33/59]
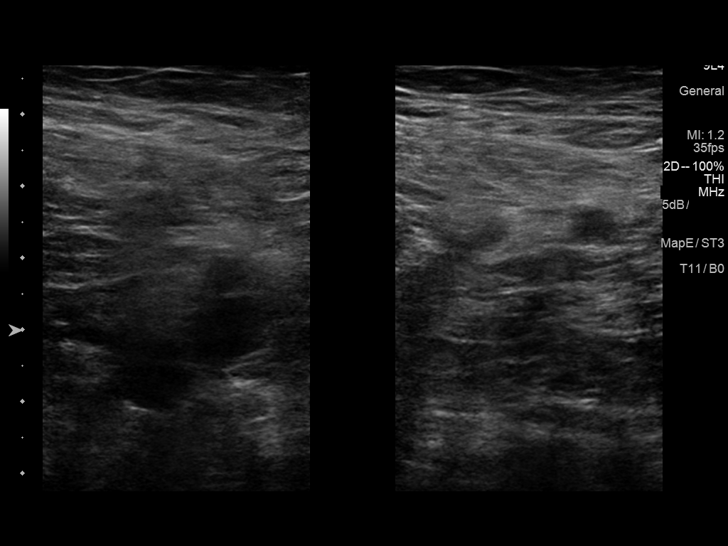
[im 38/59]
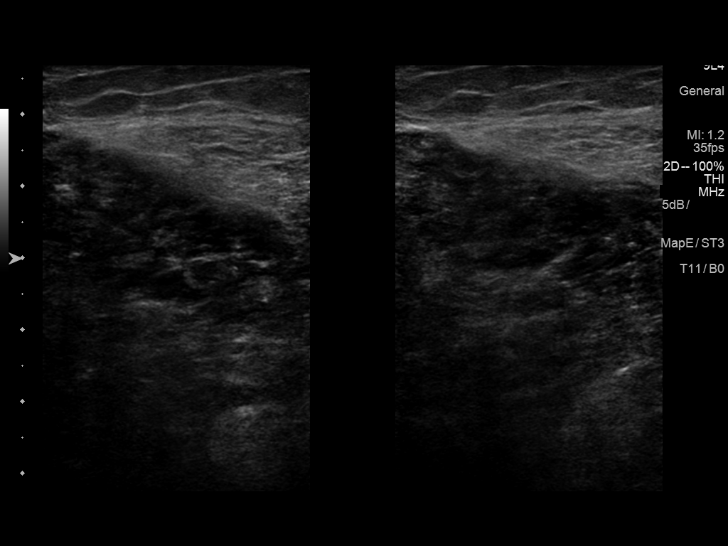
[im 43/59]
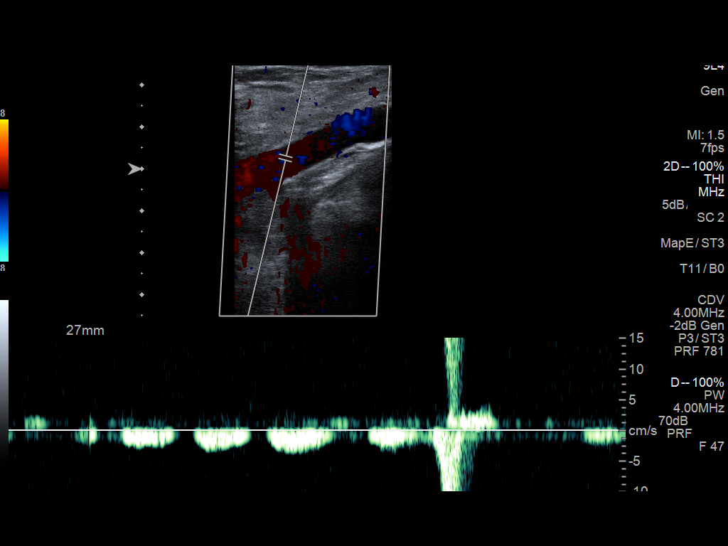
[im 48/59]
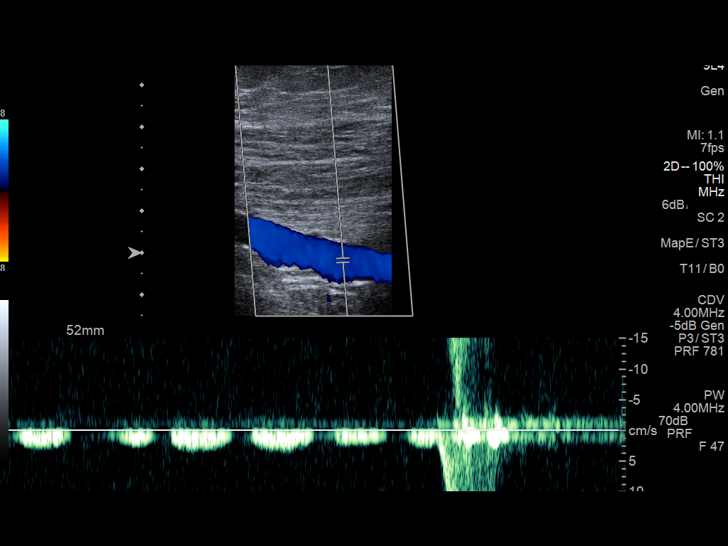
[im 53/59]
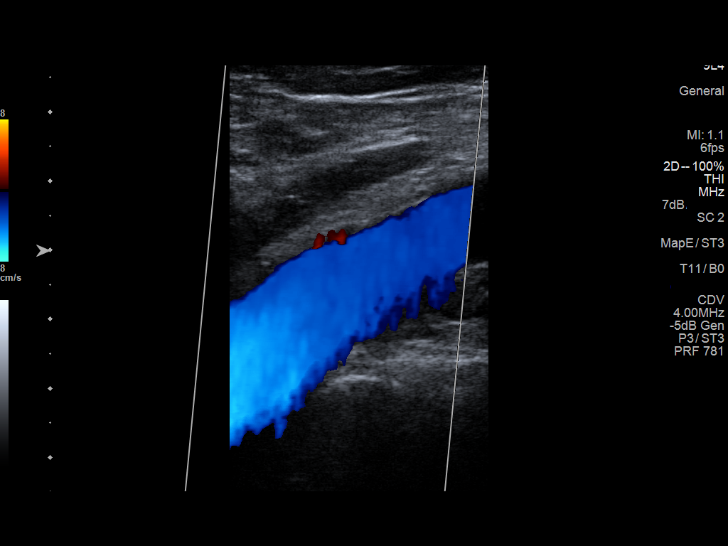
[im 59/59]
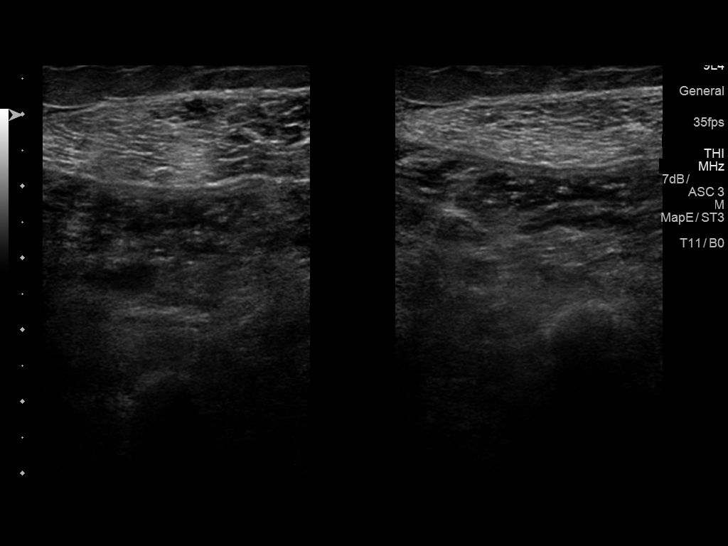

[13 of 24 positions shown; findings below may reference images not displayed]

FINDINGS: RIGHT LOWER EXTREMITY

Common Femoral Vein: No evidence of thrombus. Normal
compressibility, respiratory phasicity and response to augmentation.

Saphenofemoral Junction: No evidence of thrombus. Normal
compressibility and flow on color Doppler imaging.

Profunda Femoral Vein: No evidence of thrombus. Normal
compressibility and flow on color Doppler imaging.

Femoral Vein: No evidence of thrombus. Normal compressibility,
respiratory phasicity and response to augmentation.

Popliteal Vein: No evidence of thrombus. Normal compressibility,
respiratory phasicity and response to augmentation.

Calf Veins: No evidence of thrombus. Normal compressibility and flow
on color Doppler imaging.

Superficial Great Saphenous Vein: No evidence of thrombus. Normal
compressibility and flow on color Doppler imaging.

Venous Reflux:  None.

Other Findings: In the region of erythema along the right calf, no
specific sonographic abnormality was identified.

LEFT LOWER EXTREMITY

Common Femoral Vein: No evidence of thrombus. Normal
compressibility, respiratory phasicity and response to augmentation.

Saphenofemoral Junction: No evidence of thrombus. Normal
compressibility and flow on color Doppler imaging.

Profunda Femoral Vein: No evidence of thrombus. Normal
compressibility and flow on color Doppler imaging.

Femoral Vein: No evidence of thrombus. Normal compressibility,
respiratory phasicity and response to augmentation.

Popliteal Vein: No evidence of thrombus. Normal compressibility,
respiratory phasicity and response to augmentation.

Calf Veins: No evidence of thrombus. Normal compressibility and flow
on color Doppler imaging.

Superficial Great Saphenous Vein: No evidence of thrombus. Normal
compressibility and flow on color Doppler imaging.

Venous Reflux:  None.

Other Findings:  None.
IMPRESSION: 1. No deep vein thrombosis is identified.
2. No specific sonographic abnormality is identified along the right
calf in the region of erythema/tenderness.

## 2019-06-18 ENCOUNTER — Other Ambulatory Visit: Payer: Self-pay | Admitting: Internal Medicine

## 2019-06-18 DIAGNOSIS — R222 Localized swelling, mass and lump, trunk: Secondary | ICD-10-CM

## 2019-07-13 ENCOUNTER — Other Ambulatory Visit: Payer: Self-pay

## 2019-07-13 ENCOUNTER — Ambulatory Visit
Admission: RE | Admit: 2019-07-13 | Discharge: 2019-07-13 | Disposition: A | Discharge status: Home / Self Care | Payer: Medicare Other | Source: Ambulatory Visit | Attending: Internal Medicine | Admitting: Internal Medicine

## 2019-07-13 DIAGNOSIS — R222 Localized swelling, mass and lump, trunk: Secondary | ICD-10-CM

## 2019-07-13 MED ORDER — GADOBENATE DIMEGLUMINE 529 MG/ML IV SOLN
13.0000 mL | Freq: Once | INTRAVENOUS | Status: AC | PRN
  Administered 2019-07-13: 15:00:00 13 mL via INTRAVENOUS

## 2023-07-04 ENCOUNTER — Other Ambulatory Visit: Payer: Self-pay | Admitting: Obstetrics and Gynecology

## 2023-07-04 DIAGNOSIS — R928 Other abnormal and inconclusive findings on diagnostic imaging of breast: Secondary | ICD-10-CM

## 2023-07-11 ENCOUNTER — Other Ambulatory Visit: Payer: Medicare Other

## 2023-07-19 ENCOUNTER — Ambulatory Visit
Admission: RE | Admit: 2023-07-19 | Discharge: 2023-07-19 | Disposition: A | Discharge status: Home / Self Care | Payer: Medicare Other | Source: Ambulatory Visit | Attending: Obstetrics and Gynecology

## 2023-07-19 ENCOUNTER — Ambulatory Visit: Payer: Medicare Other

## 2023-07-19 DIAGNOSIS — R928 Other abnormal and inconclusive findings on diagnostic imaging of breast: Secondary | ICD-10-CM
